# Patient Record
Sex: Male | Born: 1945 | Hispanic: No | Marital: Married | Smoking: Former smoker
Health system: Southern US, Community
[De-identification: ages and names within clinical notes are randomized; demographics above are authoritative.]

## PROBLEM LIST (undated history)

## (undated) DIAGNOSIS — I2510 Atherosclerotic heart disease of native coronary artery without angina pectoris: Secondary | ICD-10-CM

## (undated) DIAGNOSIS — E119 Type 2 diabetes mellitus without complications: Secondary | ICD-10-CM

## (undated) DIAGNOSIS — N200 Calculus of kidney: Secondary | ICD-10-CM

## (undated) DIAGNOSIS — I10 Essential (primary) hypertension: Secondary | ICD-10-CM

## (undated) DIAGNOSIS — G709 Myoneural disorder, unspecified: Secondary | ICD-10-CM

## (undated) DIAGNOSIS — M1990 Unspecified osteoarthritis, unspecified site: Secondary | ICD-10-CM

## (undated) DIAGNOSIS — Z87442 Personal history of urinary calculi: Secondary | ICD-10-CM

## (undated) DIAGNOSIS — K219 Gastro-esophageal reflux disease without esophagitis: Secondary | ICD-10-CM

## (undated) DIAGNOSIS — Z8719 Personal history of other diseases of the digestive system: Secondary | ICD-10-CM

## (undated) HISTORY — PX: OTHER SURGICAL HISTORY: SHX169

## (undated) HISTORY — PX: CORONARY ANGIOPLASTY: SHX604

## (undated) HISTORY — PX: TONSILLECTOMY: SUR1361

## (undated) HISTORY — PX: MASTOIDECTOMY: SUR855

---

## 2015-02-23 ENCOUNTER — Other Ambulatory Visit: Payer: Self-pay | Admitting: Neurosurgery

## 2015-02-26 NOTE — H&P (Signed)
Patient ID:   [id number] Patient: Marcus Carlson  Date of Birth: 12-04-1945 Visit Type: Office Visit   Date: 02/22/2015 11:30 AM Provider: Danae OrleansJoseph D. Venetia MaxonStern MD   This 69 year old male presents for back pain and numbness.  History of Present Illness: 1.  back pain  2.  numbness  Marcus Carlson, 69 year old retired male, visits on urgent referral from Dr. Antony Maduraaly in [place] for left foot drop.  Patient states he has been working with Dr. Antony Maduraaly trying to avoid surgery for left leg pain, weakness, and numbness.  He recalls driving to [place] October 1 in finding his left foot numb and weak upon exiting his car.  Currently, he is unable to dorsiflex with the left great toe.  Cervical ESI 3 many years ago Lumbar ESI 1 decreased pain but did not relieve numbness or weakness  History: NIDDM Surgical history: Mastoid cyst resection 1991, bicep repair 1997, tonsillectomy in childhood, 2006 cardiac stent  Patient is followed by Dr. Hyacinth MeekerMiller, cardiologist in [place].  He states his last stress test was last year and was found to be normal.  MRI and x-ray on [facility]  Lumbar imaging demonstrates severe spinal stenosis at the L4 L5 level.  There is minimal spondylolisthesis at this level.  The patient has developed a left foot drop over the last month.        PAST MEDICAL/SURGICAL HISTORY   (Detailed)  Disease/disorder Onset Date Management Date Comments  Anxiety      Diabetes mellitus      High cholesterol      Hypertension         PAST MEDICAL HISTORY, SURGICAL HISTORY, FAMILY HISTORY, SOCIAL HISTORY AND REVIEW OF SYSTEMS I have reviewed the patient's past medical, surgical, family and social history as well as the comprehensive review of systems as included on the [facility] history form dated 02/22/2015, which I have signed.  Family History  (Detailed)   SOCIAL HISTORY  (Detailed) Tobacco use reviewed. Preferred language is Unknown.   Smoking status:  Former smoker.  SMOKING STATUS Use Status Type Smoking Status Usage Per Day Years Used Total Pack Years  yes  Former smoker       HOME ENVIRONMENT/SAFETY The patient has not fallen in the last year.        MEDICATIONS(added, continued or stopped this visit): Started Medication Directions Instruction Stopped   aspirin 81 mg tablet,delayed release take 1 tablet by oral route  every day     Cymbalta take 1 capsule by oral route  every day       ALLERGIES: Ingredient Reaction Medication Name Comment  IOVERSOL Nausea/Vomiting     Reviewed, updated.   REVIEW OF SYSTEMS System Neg/Pos Details  Constitutional Negative Chills, fatigue, fever, malaise, night sweats, weight gain and weight loss.  ENMT Negative Ear drainage, hearing loss, nasal drainage, otalgia, sinus pressure and sore throat.  Eyes Negative Eye discharge, eye pain and vision changes.  Respiratory Negative Chronic cough, cough, dyspnea, known TB exposure and wheezing.  Cardio Negative Chest pain, claudication, edema and irregular heartbeat/palpitations.  GI Negative Abdominal pain, blood in stool, change in stool pattern, constipation, decreased appetite, diarrhea, heartburn, nausea and vomiting.  GU Negative Dribbling, dysuria, erectile dysfunction, hematuria, polyuria, slow stream, urinary frequency, urinary incontinence and urinary retention.  Endocrine Negative Cold intolerance, heat intolerance, polydipsia and polyphagia.  Neuro Positive Gait disturbance.  Psych Negative Anxiety, depression and insomnia.  Integumentary Negative Brittle hair, brittle nails, change in shape/size of  mole(s), hair loss, hirsutism, hives, pruritus, rash and skin lesion.  MS Positive Muscle weakness.  Hema/Lymph Negative Easy bleeding, easy bruising and lymphadenopathy.  Allergic/Immuno Negative Contact allergy, environmental allergies, food allergies and seasonal allergies.  Reproductive Negative Penile discharge and sexual  dysfunction.     Vitals Date Temp F BP Pulse Ht In Wt Lb BMI BSA Pain Score  02/22/2015  145/85 98 75 228 28.5  2/10     PHYSICAL EXAM General Level of Distress: no acute distress Overall Appearance: normal  Head and Face  Right Left  Fundoscopic Exam:  normal normal    Cardiovascular Cardiac: regular rate and rhythm without murmur  Right Left  Carotid Pulses: normal normal  Respiratory Lungs: clear to auscultation  Neurological Orientation: normal Recent and Remote Memory: normal Attention Span and Concentration:   normal Language: normal Fund of Knowledge: normal  Right Left Sensation: normal normal Upper Extremity Coordination: normal normal  Lower Extremity Coordination: normal normal  Musculoskeletal Gait and Station: normal  Right Left Upper Extremity Muscle Strength: normal normal Lower Extremity Muscle Strength: normal normal Upper Extremity Muscle Tone:  normal normal Lower Extremity Muscle Tone: normal normal  Motor Strength Upper and lower extremity motor strength was tested in the clinically pertinent muscles. Any abnormal findings will be noted below.   Right Left Tib Anterior:  4-/5 EHL:  3/5   Deep Tendon Reflexes  Right Left Biceps: normal normal Triceps: normal normal Brachiloradialis: normal normal Patellar: normal normal Achilles: normal normal  Sensory Sensation was tested at L1 to S1. Any abnormal findings will be noted below.  Right Left L5:  decreased   Cranial Nerves II. Optic Nerve/Visual Fields: normal III. Oculomotor: normal IV. Trochlear: normal V. Trigeminal: normal VI. Abducens: normal VII. Facial: normal VIII. Acoustic/Vestibular: normal IX. Glossopharyngeal: normal X. Vagus: normal XI. Spinal Accessory: normal XII. Hypoglossal: normal  Motor and other Tests Lhermittes: negative Rhomberg: negative Pronator  drift: absent     Right Left Hoffman's: normal normal Clonus: normal normal Babinski: normal normal SLR: negative positive at 40 degrees Patrick's Pearlean Brownie): negative negative Toe Walk: normal normal Toe Lift: normal normal Heel Walk: normal normal SI Joint: nontender nontender   Additional Findings:  The patient has 4 minus out of 5 left hip abductor weakness.    IMPRESSION The patient has a subacute left foot drop and severe spinal stenosis at the L4 L5 level.  I have recommended expedited surgery for this condition.  The patient wanted to hold off until after Thanksgiving as he has already made plans to visit his family in [place].  We will therefore proceed with decompression and fusion at the L4 L5 level on 03/10/15.  Completed Orders (this encounter) Order Details Reason Side Interpretation Result Initial Treatment Date Region  Lumbar Spine- AP/Lat/Obls/Spot/Flex/Ex      02/22/2015 All Levels to All Levels   Assessment/Plan # Detail Type Description   1. Assessment Idiopathic scoliosis of lumbar region (M41.26).       2. Assessment Lumbar radiculopathy (M54.16).       3. Assessment Spondylolisthesis, lumbar region (M43.16).       4. Assessment Lumbar stenosis (M48.06).         Pain Assessment/Treatment Pain Scale: 2/10. Method: Numeric Pain Intensity Scale. Location: back. Onset: 01/07/2015. Duration: varies. Quality: discomforting. Pain Assessment/Treatment follow-up plan of care: Patient is taking medications as prescribed..  Fall Risk Plan The patient has not fallen in the last year.  The patient was fitted fora brace today.  We  are arranging for him to have cardiology clearance with Dr. Hyacinth Meeker in [place] prior to surgery.  The risks and benefits were discussed in detail with the patient and he wishes to proceed.  We went over models and his imaging and he has a good understanding of the pathophysiology of his condition and the planned surgical  intervention.  Orders: Diagnostic Procedures: Assessment Procedure  M43.16 L4-L5 MAS PLIF  M43.16 Lumbar Spine- AP/Lat  M43.16 Return to Clinic  M54.16 Lumbar Spine- AP/Lat/Obls/Spot/Flex/Ex             Provider:  Danae Orleans. Venetia Maxon MD  02/25/2015 03:43 PM Dictation edited by: Danae Orleans. Venetia Maxon    CC Providers: Collene Leyden [address] [place], [place] [address]-              Electronically signed by Danae Orleans Venetia Maxon MD on 02/25/2015 03:43 PM

## 2015-03-07 NOTE — Progress Notes (Addendum)
Anesthesia Chart Review: Patient is a 69 year old male scheduled for L4-5 MAS PLIF on 03/10/15 by Dr. Venetia MaxonStern. PAT is scheduled for 03/09/15.  Dr. Fredrich BirksStern's office faxed over cardiology records with clearance note from Dr. Daryel NovemberGary Miller with [facility] [place]. According to these records, history includes CAD, DM2, dyslipidemia, GERD, nicotine excess (resolved in 2006). PCP is Dr. Heron NayVasireddy.   07/13/14 EKG (Dr. Hyacinth MeekerMiller): NSR.  04/11/14 Nuclear stress test (Dr. Hyacinth MeekerMiller): Conclusion:  1. Summed rest score = 4. Summed stress score o= 6. 2. Overall good LV contraction. EF 62%. 3. No significant reversible ischemic defect. 4. Low risk scan.  Med list is not yet recorded. As of 07/2014, he was on duloxetine, Nexium, amlodipine, lisinopril/HCTZ, glimepiride, clopidogrel, atorvastatin, Celebrex, metformin, ASA 81 mg, Coreg. Dr. Hyacinth MeekerMiller has given permission to hold Plavix for five days prior to surgery, but wanted patient to continue ASA. Marland Kitchen.   He will get labs at his PAT visit. (Update12/1/16 2:44PM: Today's CBC, BMET, T&S noted. A1C is in process. He reported fasting CBGs run 104-150.)  History will be completed/updated at his PAT visit. If any additional records needed then they will be requested at that time.  Velna Ochsllison Racine Erby, PA-C [facility] Short [facility]/Anesthesiology Phone [phone] 03/07/2015 5:50 PM

## 2015-03-09 ENCOUNTER — Encounter (HOSPITAL_COMMUNITY): Payer: Self-pay

## 2015-03-09 ENCOUNTER — Encounter (HOSPITAL_COMMUNITY)
Admission: RE | Admit: 2015-03-09 | Discharge: 2015-03-09 | Disposition: A | Discharge status: Home / Self Care | Payer: Medicare Other | Source: Ambulatory Visit | Attending: Neurosurgery | Admitting: Neurosurgery

## 2015-03-09 HISTORY — DX: Myoneural disorder, unspecified: G70.9

## 2015-03-09 HISTORY — DX: Gastro-esophageal reflux disease without esophagitis: K21.9

## 2015-03-09 HISTORY — DX: Essential (primary) hypertension: I10

## 2015-03-09 HISTORY — DX: Personal history of other diseases of the digestive system: Z87.19

## 2015-03-09 HISTORY — DX: Atherosclerotic heart disease of native coronary artery without angina pectoris: I25.10

## 2015-03-09 HISTORY — DX: Type 2 diabetes mellitus without complications: E11.9

## 2015-03-09 HISTORY — DX: Calculus of kidney: N20.0

## 2015-03-09 HISTORY — DX: Unspecified osteoarthritis, unspecified site: M19.90

## 2015-03-09 LAB — BASIC METABOLIC PANEL
ANION GAP: 8 (ref 5–15)
BUN: 9 mg/dL (ref 6–20)
CALCIUM: 9 mg/dL (ref 8.9–10.3)
CO2: 27 mmol/L (ref 22–32)
Chloride: 105 mmol/L (ref 101–111)
Creatinine, Ser: 0.91 mg/dL (ref 0.61–1.24)
GFR calc Af Amer: >60 mL/min (ref >60)
GLUCOSE: 148 mg/dL — AB (ref 65–99)
Potassium: 4 mmol/L (ref 3.5–5.1)
Sodium: 140 mmol/L (ref 135–145)

## 2015-03-09 LAB — TYPE AND SCREEN
ABO/RH(D): A POS
ANTIBODY SCREEN: NEGATIVE

## 2015-03-09 LAB — CBC
HCT: 42.8 % (ref 39.0–52.0)
Hemoglobin: 13.8 g/dL (ref 13.0–17.0)
MCH: 29.8 pg (ref 26.0–34.0)
MCHC: 32.2 g/dL (ref 30.0–36.0)
MCV: 92.4 fL (ref 78.0–100.0)
Platelets: 324 10*3/uL (ref 150–400)
RBC: 4.63 MIL/uL (ref 4.22–5.81)
RDW: 12.9 % (ref 11.5–15.5)
WBC: 5.6 10*3/uL (ref 4.0–10.5)

## 2015-03-09 LAB — SURGICAL PCR SCREEN
MRSA, PCR: NEGATIVE
STAPHYLOCOCCUS AUREUS: NEGATIVE

## 2015-03-09 LAB — GLUCOSE, CAPILLARY: Glucose-Capillary: 164 mg/dL — ABNORMAL HIGH (ref 65–99)

## 2015-03-09 LAB — ABO/RH: ABO/RH(D): A POS

## 2015-03-09 NOTE — Pre-Procedure Instructions (Signed)
Marcus [facility]son  03/09/2015      [facility] [address] - [place], [place] - [id number] [place] [address] Phone: [phone] Fax: [phone]  [facility] [place], [place] - [address] [place] [id number] Phone: [phone] Fax: [phone]    Your procedure is scheduled on Dec. 2   [facility] Admitting at 1100 A.M.  Call this number if you have problems the morning of surgery:  [phone]   Remember:  Do not eat food or drink liquids after midnight.  Take these medicines the morning of surgery with A SIP OF WATER amlodipine (norvasc), Take aspirin as directed by your Dr., Carvedilol (Coreg), Duloxetine (cymbalta), fluticasone (Flonase), ranitidine (zantac)  Stop taking aleve, BC's, goody's, Herbal medications, Fish Oil, Plavix (as directed by your Dr) How to Manage Your Diabetes Before Surgery   Why is it important to control my blood sugar before and after surgery?   Improving blood sugar levels before and after surgery helps healing and can limit problems.  A way of improving blood sugar control is eating a healthy diet by:  - Eating less sugar and carbohydrates  - Increasing activity/exercise  - Talk with your doctor about reaching your blood sugar goals  High blood sugars (greater than 180 mg/dL) can raise your risk of infections and slow down your recovery so you will need to focus on controlling your diabetes during the weeks before surgery.  Make sure that the doctor who takes care of your diabetes knows about your planned surgery including the date and location.  How do I manage my blood sugars before surgery?   Check your blood sugar at least 4 times a day, 2 days before surgery to make sure that they are not too high or low.   Check your blood sugar the morning of your surgery when you wake up and every 2               hours until you get to the  [facility] unit.  If your blood sugar is less than 70 mg/dL, you will need to treat for low blood sugar by:  Treat a low blood sugar (less than 70 mg/dL) with 1/2 cup of clear juice (cranberry or apple), 4 glucose tablets, OR glucose gel.  Recheck blood sugar in 15 minutes after treatment (to make sure it is greater than 70 mg/dL).  If blood sugar is not greater than 70 mg/dL on re-check, call [phone] for further instructions.   Report your blood sugar to the [facility] nurse when you get to [facility].  References:  [facility], 2007 "How to Manage your Diabetes Before and After Surgery".  What do I do about my diabetes medications?   Do not take oral diabetes medicines (pills) the morning of surgery. Glimepiride (Amaryl) or metformin (Glucophage)   Do not take other diabetes injectables the day of surgery including Byetta, Victoza, Bydureon, and Trulicity.    If your CBG is greater than 220 mg/dL, you may take 1/2 of your sliding scale (correction) dose of insulin.   Do not wear jewelry, make-up or nail polish.  Do not wear lotions, powders, or perfumes.  You may wear deodorant.  Do not shave 48 hours prior to surgery.  Men may shave face and neck.  Do not bring valuables to the [facility].  [facility] is not responsible for any belongings  or valuables.  Contacts, dentures or bridgework may not be worn into surgery.  Leave your suitcase in the car.  After surgery it may be brought to your room.  For patients admitted to the [facility], discharge time will be determined by your treatment team.  Patients discharged the day of surgery will not be allowed to drive home.   Special instructions:  [facility] - Preparing for Surgery  Before surgery, you can play an important role.  Because skin is not sterile, your skin needs to be as free of germs as possible.  You can reduce the number of germs on you skin by washing with CHG (chlorahexidine gluconate)  soap before surgery.  CHG is an antiseptic cleaner which kills germs and bonds with the skin to continue killing germs even after washing.  Please DO NOT use if you have an allergy to CHG or antibacterial soaps.  If your skin becomes reddened/irritated stop using the CHG and inform your nurse when you arrive at Short Stay.  Do not shave (including legs and underarms) for at least 48 hours prior to the first CHG shower.  You may shave your face.  Please follow these instructions carefully:   1.  Shower with CHG Soap the night before surgery and the     morning of Surgery.  2.  If you choose to wash your hair, wash your hair first as usual with your  normal shampoo.  3.  After you shampoo, rinse your hair and body thoroughly to remove the  Shampoo.  4.  Use CHG as you would any other liquid soap.  You can apply chg directly  to the skin and wash gently with scrungie or a clean washcloth.  5.  Apply the CHG Soap to your body ONLY FROM THE NECK DOWN.    Do not use on open wounds or open sores.  Avoid contact with your eyes, ears, mouth and genitals (private parts).  Wash genitals (private parts) with your normal soap.  6.  Wash thoroughly, paying special attention to the area where your surgery  will be performed.  7.  Thoroughly rinse your body with warm water from the neck down.  8.  DO NOT shower/wash with your normal soap after using and rinsing off    the CHG Soap.  9.  Pat yourself dry with a clean towel.            10.  Wear clean pajamas.            11.  Place clean sheets on your bed the night of your first shower and do not  sleep with pets.  Day of Surgery  Do not apply any lotions/deoderants the morning of surgery.  Please wear clean clothes to the [facility]/surgery [facility].    Please read over the following fact sheets that you were given. Pain Booklet, Coughing and Deep Breathing, MRSA Information and Surgical Site Infection Prevention

## 2015-03-09 NOTE — Progress Notes (Addendum)
PCP is Dr Heron NayVasireddy Cardiologist is Dr Hyacinth MeekerMiller- clearance placed on chart Reports his fasting CBG's run 104-150's. Last dose of Plavix was 03-02-15, and has continued to take his ASA as directed.

## 2015-03-10 ENCOUNTER — Inpatient Hospital Stay (HOSPITAL_COMMUNITY): Payer: Medicare Other | Admitting: Vascular Surgery

## 2015-03-10 ENCOUNTER — Encounter (HOSPITAL_COMMUNITY): Payer: Self-pay | Admitting: *Deleted

## 2015-03-10 ENCOUNTER — Inpatient Hospital Stay (HOSPITAL_COMMUNITY): Payer: Medicare Other

## 2015-03-10 ENCOUNTER — Encounter (HOSPITAL_COMMUNITY)
Admission: RE | Disposition: A | Discharge status: Home / Self Care | Payer: Self-pay | Source: Ambulatory Visit | Attending: Neurosurgery

## 2015-03-10 ENCOUNTER — Inpatient Hospital Stay (HOSPITAL_COMMUNITY): Payer: Medicare Other | Admitting: Anesthesiology

## 2015-03-10 ENCOUNTER — Inpatient Hospital Stay (HOSPITAL_COMMUNITY)
Admission: RE | Admit: 2015-03-10 | Discharge: 2015-03-11 | DRG: 460 | Disposition: A | Discharge status: Home / Self Care | Payer: Medicare Other | Source: Ambulatory Visit | Attending: Neurosurgery | Admitting: Neurosurgery

## 2015-03-10 DIAGNOSIS — Z419 Encounter for procedure for purposes other than remedying health state, unspecified: Secondary | ICD-10-CM

## 2015-03-10 DIAGNOSIS — M5416 Radiculopathy, lumbar region: Principal | ICD-10-CM | POA: Diagnosis present

## 2015-03-10 DIAGNOSIS — F419 Anxiety disorder, unspecified: Secondary | ICD-10-CM | POA: Diagnosis present

## 2015-03-10 DIAGNOSIS — Z955 Presence of coronary angioplasty implant and graft: Secondary | ICD-10-CM

## 2015-03-10 DIAGNOSIS — I10 Essential (primary) hypertension: Secondary | ICD-10-CM | POA: Diagnosis present

## 2015-03-10 DIAGNOSIS — I2510 Atherosclerotic heart disease of native coronary artery without angina pectoris: Secondary | ICD-10-CM | POA: Diagnosis present

## 2015-03-10 DIAGNOSIS — Z87891 Personal history of nicotine dependence: Secondary | ICD-10-CM

## 2015-03-10 DIAGNOSIS — E7800 Pure hypercholesterolemia, unspecified: Secondary | ICD-10-CM | POA: Diagnosis present

## 2015-03-10 DIAGNOSIS — K219 Gastro-esophageal reflux disease without esophagitis: Secondary | ICD-10-CM | POA: Diagnosis present

## 2015-03-10 DIAGNOSIS — M4120 Other idiopathic scoliosis, site unspecified: Secondary | ICD-10-CM | POA: Diagnosis present

## 2015-03-10 DIAGNOSIS — M4316 Spondylolisthesis, lumbar region: Secondary | ICD-10-CM | POA: Diagnosis present

## 2015-03-10 DIAGNOSIS — M21372 Foot drop, left foot: Secondary | ICD-10-CM | POA: Diagnosis present

## 2015-03-10 DIAGNOSIS — M549 Dorsalgia, unspecified: Secondary | ICD-10-CM | POA: Diagnosis present

## 2015-03-10 DIAGNOSIS — M4806 Spinal stenosis, lumbar region: Secondary | ICD-10-CM | POA: Diagnosis present

## 2015-03-10 DIAGNOSIS — E119 Type 2 diabetes mellitus without complications: Secondary | ICD-10-CM | POA: Diagnosis present

## 2015-03-10 HISTORY — PX: MAXIMUM ACCESS (MAS)POSTERIOR LUMBAR INTERBODY FUSION (PLIF) 1 LEVEL: SHX6368

## 2015-03-10 HISTORY — DX: Type 2 diabetes mellitus without complications: E11.9

## 2015-03-10 LAB — GLUCOSE, CAPILLARY
GLUCOSE-CAPILLARY: 146 mg/dL — AB (ref 65–99)
GLUCOSE-CAPILLARY: 226 mg/dL — AB (ref 65–99)
Glucose-Capillary: 169 mg/dL — ABNORMAL HIGH (ref 65–99)
Glucose-Capillary: 162 mg/dL — ABNORMAL HIGH (ref 65–99)

## 2015-03-10 LAB — HEMOGLOBIN A1C
HEMOGLOBIN A1C: 8.5 % — AB (ref 4.8–5.6)
Mean Plasma Glucose: 197 mg/dL

## 2015-03-10 SURGERY — FOR MAXIMUM ACCESS (MAS) POSTERIOR LUMBAR INTERBODY FUSION (PLIF) 1 LEVEL
Anesthesia: General | Site: Back

## 2015-03-10 MED ORDER — BUPIVACAINE LIPOSOME 1.3 % IJ SUSP
INTRAMUSCULAR | Status: DC | PRN
  Administered 2015-03-10: 20 mL

## 2015-03-10 MED ORDER — SUCCINYLCHOLINE CHLORIDE 20 MG/ML IJ SOLN
INTRAMUSCULAR | Status: DC | PRN
  Administered 2015-03-10: 140 mg via INTRAVENOUS

## 2015-03-10 MED ORDER — PHENYLEPHRINE 40 MCG/ML (10ML) SYRINGE FOR IV PUSH (FOR BLOOD PRESSURE SUPPORT)
PREFILLED_SYRINGE | INTRAVENOUS | Status: AC
  Filled 2015-03-10: qty 10

## 2015-03-10 MED ORDER — 0.9 % SODIUM CHLORIDE (POUR BTL) OPTIME
TOPICAL | Status: DC | PRN
  Administered 2015-03-10: 1000 mL

## 2015-03-10 MED ORDER — FENTANYL CITRATE (PF) 100 MCG/2ML IJ SOLN
INTRAMUSCULAR | Status: AC
Start: 2015-03-10 — End: 2015-03-11
  Filled 2015-03-10: qty 2

## 2015-03-10 MED ORDER — HYDROMORPHONE HCL 1 MG/ML IJ SOLN: 0.5000 mg | INTRAMUSCULAR | Status: DC | PRN

## 2015-03-10 MED ORDER — PROPOFOL 500 MG/50ML IV EMUL
INTRAVENOUS | Status: DC | PRN
  Administered 2015-03-10: 50 ug/kg/min via INTRAVENOUS

## 2015-03-10 MED ORDER — FENTANYL CITRATE (PF) 250 MCG/5ML IJ SOLN
INTRAMUSCULAR | Status: AC
  Filled 2015-03-10: qty 5

## 2015-03-10 MED ORDER — LISINOPRIL 20 MG PO TABS
20.0000 mg | ORAL_TABLET | Freq: Every day | ORAL | Status: DC
  Administered 2015-03-11: 20 mg via ORAL
  Filled 2015-03-10: qty 1

## 2015-03-10 MED ORDER — OXYCODONE-ACETAMINOPHEN 5-325 MG PO TABS
ORAL_TABLET | ORAL | Status: AC
  Administered 2015-03-10: 1 via ORAL
  Filled 2015-03-10: qty 1

## 2015-03-10 MED ORDER — BISACODYL 10 MG RE SUPP: 10.0000 mg | Freq: Every day | RECTAL | Status: DC | PRN

## 2015-03-10 MED ORDER — ONDANSETRON HCL 4 MG/2ML IJ SOLN: 4.0000 mg | Freq: Once | INTRAMUSCULAR | Status: DC | PRN

## 2015-03-10 MED ORDER — ATORVASTATIN CALCIUM 20 MG PO TABS
20.0000 mg | ORAL_TABLET | Freq: Every day | ORAL | Status: DC
  Administered 2015-03-10: 20 mg via ORAL
  Filled 2015-03-10: qty 1

## 2015-03-10 MED ORDER — PROPOFOL 10 MG/ML IV BOLUS
INTRAVENOUS | Status: AC
  Filled 2015-03-10: qty 20

## 2015-03-10 MED ORDER — CEFAZOLIN SODIUM 1-5 GM-% IV SOLN
1.0000 g | Freq: Three times a day (TID) | INTRAVENOUS | Status: AC
  Administered 2015-03-10 – 2015-03-11 (×2): 1 g via INTRAVENOUS
  Filled 2015-03-10 (×2): qty 50

## 2015-03-10 MED ORDER — OXYCODONE-ACETAMINOPHEN 5-325 MG PO TABS
1.0000 | ORAL_TABLET | ORAL | Status: DC | PRN
  Administered 2015-03-10: 1 via ORAL
  Administered 2015-03-10: 2 via ORAL
  Administered 2015-03-10: 1 via ORAL
  Administered 2015-03-11 (×2): 2 via ORAL
  Filled 2015-03-10 (×3): qty 2

## 2015-03-10 MED ORDER — PROPOFOL 10 MG/ML IV BOLUS
INTRAVENOUS | Status: DC | PRN
  Administered 2015-03-10: 150 mg via INTRAVENOUS

## 2015-03-10 MED ORDER — FLEET ENEMA 7-19 GM/118ML RE ENEM
1.0000 | ENEMA | Freq: Once | RECTAL | Status: DC | PRN
Start: 2015-03-10 — End: 2015-03-11

## 2015-03-10 MED ORDER — LIDOCAINE HCL (CARDIAC) 20 MG/ML IV SOLN
INTRAVENOUS | Status: DC | PRN
  Administered 2015-03-10: 100 mg via INTRAVENOUS

## 2015-03-10 MED ORDER — GLIMEPIRIDE 1 MG PO TABS
1.0000 mg | ORAL_TABLET | Freq: Every day | ORAL | Status: DC
  Administered 2015-03-10: 1 mg via ORAL
  Filled 2015-03-10 (×2): qty 1

## 2015-03-10 MED ORDER — LIDOCAINE-EPINEPHRINE 1 %-1:100000 IJ SOLN
INTRAMUSCULAR | Status: DC | PRN
  Administered 2015-03-10: 5 mL

## 2015-03-10 MED ORDER — LACTATED RINGERS IV SOLN
INTRAVENOUS | Status: DC
  Administered 2015-03-10 (×4): via INTRAVENOUS

## 2015-03-10 MED ORDER — DEXTROSE 5 % IV SOLN
10.0000 mg | INTRAVENOUS | Status: DC | PRN
  Administered 2015-03-10: 20 ug/min via INTRAVENOUS

## 2015-03-10 MED ORDER — AMLODIPINE BESYLATE 10 MG PO TABS
10.0000 mg | ORAL_TABLET | Freq: Every day | ORAL | Status: DC
  Administered 2015-03-11: 10 mg via ORAL
  Filled 2015-03-10: qty 1

## 2015-03-10 MED ORDER — BUPIVACAINE HCL (PF) 0.5 % IJ SOLN
INTRAMUSCULAR | Status: DC | PRN
  Administered 2015-03-10: 5 mL

## 2015-03-10 MED ORDER — PANTOPRAZOLE SODIUM 40 MG IV SOLR
40.0000 mg | Freq: Every day | INTRAVENOUS | Status: DC
  Administered 2015-03-10: 40 mg via INTRAVENOUS
  Filled 2015-03-10: qty 40

## 2015-03-10 MED ORDER — METHOCARBAMOL 500 MG PO TABS
ORAL_TABLET | ORAL | Status: AC
  Administered 2015-03-10: 500 mg via ORAL
  Filled 2015-03-10: qty 1

## 2015-03-10 MED ORDER — ASPIRIN EC 81 MG PO TBEC
81.0000 mg | DELAYED_RELEASE_TABLET | Freq: Every day | ORAL | Status: DC
  Administered 2015-03-11: 81 mg via ORAL
  Filled 2015-03-10 (×2): qty 1

## 2015-03-10 MED ORDER — KCL IN DEXTROSE-NACL 20-5-0.45 MEQ/L-%-% IV SOLN
INTRAVENOUS | Status: DC
  Filled 2015-03-10 (×3): qty 1000

## 2015-03-10 MED ORDER — INSULIN ASPART 100 UNIT/ML ~~LOC~~ SOLN: 0.0000 [IU] | Freq: Three times a day (TID) | SUBCUTANEOUS | Status: DC

## 2015-03-10 MED ORDER — OXYCODONE-ACETAMINOPHEN 5-325 MG PO TABS
ORAL_TABLET | ORAL | Status: AC
  Filled 2015-03-10: qty 1

## 2015-03-10 MED ORDER — CARVEDILOL 6.25 MG PO TABS
6.2500 mg | ORAL_TABLET | Freq: Two times a day (BID) | ORAL | Status: DC
  Administered 2015-03-10 – 2015-03-11 (×2): 6.25 mg via ORAL
  Filled 2015-03-10 (×2): qty 1

## 2015-03-10 MED ORDER — METHOCARBAMOL 1000 MG/10ML IJ SOLN
500.0000 mg | Freq: Four times a day (QID) | INTRAVENOUS | Status: DC | PRN
  Filled 2015-03-10: qty 5

## 2015-03-10 MED ORDER — METHOCARBAMOL 500 MG PO TABS
500.0000 mg | ORAL_TABLET | Freq: Four times a day (QID) | ORAL | Status: DC | PRN
  Administered 2015-03-10 – 2015-03-11 (×3): 500 mg via ORAL
  Filled 2015-03-10 (×2): qty 1

## 2015-03-10 MED ORDER — ROCURONIUM BROMIDE 50 MG/5ML IV SOLN
INTRAVENOUS | Status: AC
  Filled 2015-03-10: qty 1

## 2015-03-10 MED ORDER — ONDANSETRON HCL 4 MG/2ML IJ SOLN: 4.0000 mg | INTRAMUSCULAR | Status: DC | PRN

## 2015-03-10 MED ORDER — LIDOCAINE HCL (CARDIAC) 20 MG/ML IV SOLN
INTRAVENOUS | Status: AC
  Filled 2015-03-10: qty 5

## 2015-03-10 MED ORDER — MENTHOL 3 MG MT LOZG
1.0000 | LOZENGE | OROMUCOSAL | Status: DC | PRN
  Filled 2015-03-10: qty 9

## 2015-03-10 MED ORDER — FLUTICASONE PROPIONATE 50 MCG/ACT NA SUSP
1.0000 | Freq: Two times a day (BID) | NASAL | Status: DC
  Administered 2015-03-10 – 2015-03-11 (×2): 1 via NASAL
  Filled 2015-03-10: qty 16

## 2015-03-10 MED ORDER — FENTANYL CITRATE (PF) 100 MCG/2ML IJ SOLN
25.0000 ug | INTRAMUSCULAR | Status: DC | PRN
  Administered 2015-03-10 (×2): 25 ug via INTRAVENOUS
  Administered 2015-03-10 (×2): 50 ug via INTRAVENOUS

## 2015-03-10 MED ORDER — HYDROCHLOROTHIAZIDE 12.5 MG PO CAPS
12.5000 mg | ORAL_CAPSULE | Freq: Every day | ORAL | Status: DC
  Administered 2015-03-11: 12.5 mg via ORAL
  Filled 2015-03-10: qty 1

## 2015-03-10 MED ORDER — CEFAZOLIN SODIUM-DEXTROSE 2-3 GM-% IV SOLR
2.0000 g | INTRAVENOUS | Status: AC
  Administered 2015-03-10: 2 g via INTRAVENOUS

## 2015-03-10 MED ORDER — DOCUSATE SODIUM 100 MG PO CAPS
100.0000 mg | ORAL_CAPSULE | Freq: Two times a day (BID) | ORAL | Status: DC
  Administered 2015-03-10 – 2015-03-11 (×2): 100 mg via ORAL
  Filled 2015-03-10 (×2): qty 1

## 2015-03-10 MED ORDER — SENNOSIDES-DOCUSATE SODIUM 8.6-50 MG PO TABS: 1.0000 | ORAL_TABLET | Freq: Every evening | ORAL | Status: DC | PRN

## 2015-03-10 MED ORDER — SODIUM CHLORIDE 0.9 % IJ SOLN
3.0000 mL | Freq: Two times a day (BID) | INTRAMUSCULAR | Status: DC
  Administered 2015-03-10: 3 mL via INTRAVENOUS

## 2015-03-10 MED ORDER — PHENOL 1.4 % MT LIQD: 1.0000 | OROMUCOSAL | Status: DC | PRN

## 2015-03-10 MED ORDER — ALUM & MAG HYDROXIDE-SIMETH 200-200-20 MG/5ML PO SUSP
30.0000 mL | Freq: Four times a day (QID) | ORAL | Status: DC | PRN
Start: 2015-03-10 — End: 2015-03-11

## 2015-03-10 MED ORDER — FENTANYL CITRATE (PF) 100 MCG/2ML IJ SOLN
INTRAMUSCULAR | Status: DC | PRN
  Administered 2015-03-10 (×2): 50 ug via INTRAVENOUS
  Administered 2015-03-10: 100 ug via INTRAVENOUS
  Administered 2015-03-10: 50 ug via INTRAVENOUS
  Administered 2015-03-10: 150 ug via INTRAVENOUS

## 2015-03-10 MED ORDER — METFORMIN HCL 500 MG PO TABS
1000.0000 mg | ORAL_TABLET | Freq: Two times a day (BID) | ORAL | Status: DC
  Administered 2015-03-10 – 2015-03-11 (×2): 1000 mg via ORAL
  Filled 2015-03-10 (×2): qty 2

## 2015-03-10 MED ORDER — LISINOPRIL-HYDROCHLOROTHIAZIDE 20-12.5 MG PO TABS: 1.0000 | ORAL_TABLET | Freq: Every day | ORAL | Status: DC

## 2015-03-10 MED ORDER — PANTOPRAZOLE SODIUM 40 MG PO TBEC: 40.0000 mg | DELAYED_RELEASE_TABLET | Freq: Every day | ORAL | Status: DC

## 2015-03-10 MED ORDER — SUCCINYLCHOLINE CHLORIDE 20 MG/ML IJ SOLN
INTRAMUSCULAR | Status: AC
  Filled 2015-03-10: qty 1

## 2015-03-10 MED ORDER — THROMBIN 20000 UNITS EX SOLR
CUTANEOUS | Status: DC | PRN
  Administered 2015-03-10: 14:00:00 via TOPICAL

## 2015-03-10 MED ORDER — CEFAZOLIN SODIUM-DEXTROSE 2-3 GM-% IV SOLR
INTRAVENOUS | Status: AC
  Filled 2015-03-10: qty 50

## 2015-03-10 MED ORDER — FENTANYL CITRATE (PF) 100 MCG/2ML IJ SOLN
INTRAMUSCULAR | Status: AC
  Administered 2015-03-10: 25 ug via INTRAVENOUS
  Filled 2015-03-10: qty 2

## 2015-03-10 MED ORDER — SODIUM CHLORIDE 0.9 % IJ SOLN: 3.0000 mL | INTRAMUSCULAR | Status: DC | PRN

## 2015-03-10 MED ORDER — FAMOTIDINE 20 MG PO TABS
10.0000 mg | ORAL_TABLET | Freq: Every day | ORAL | Status: DC
  Administered 2015-03-10 – 2015-03-11 (×2): 10 mg via ORAL
  Filled 2015-03-10 (×2): qty 1

## 2015-03-10 MED ORDER — DOCUSATE SODIUM 100 MG PO CAPS: 100.0000 mg | ORAL_CAPSULE | Freq: Two times a day (BID) | ORAL | Status: DC

## 2015-03-10 MED ORDER — DULOXETINE HCL 30 MG PO CPEP
60.0000 mg | ORAL_CAPSULE | Freq: Every day | ORAL | Status: DC
  Administered 2015-03-11: 60 mg via ORAL
  Filled 2015-03-10: qty 2

## 2015-03-10 MED ORDER — ONDANSETRON HCL 4 MG/2ML IJ SOLN
INTRAMUSCULAR | Status: DC | PRN
Start: 2015-03-10 — End: 2015-03-10
  Administered 2015-03-10: 4 mg via INTRAVENOUS

## 2015-03-10 MED ORDER — ACETAMINOPHEN 650 MG RE SUPP: 650.0000 mg | RECTAL | Status: DC | PRN

## 2015-03-10 MED ORDER — ACETAMINOPHEN 325 MG PO TABS: 650.0000 mg | ORAL_TABLET | ORAL | Status: DC | PRN

## 2015-03-10 MED ORDER — HYDROCODONE-ACETAMINOPHEN 5-325 MG PO TABS
1.0000 | ORAL_TABLET | ORAL | Status: DC | PRN
Start: 2015-03-10 — End: 2015-03-11

## 2015-03-10 MED ORDER — ONDANSETRON HCL 4 MG/2ML IJ SOLN
INTRAMUSCULAR | Status: AC
  Filled 2015-03-10: qty 2

## 2015-03-10 MED ORDER — HYDROCORTISONE 1 % EX CREA
TOPICAL_CREAM | Freq: Two times a day (BID) | CUTANEOUS | Status: DC
  Administered 2015-03-10: 21:00:00 via TOPICAL
  Filled 2015-03-10: qty 28

## 2015-03-10 MED ORDER — BUPIVACAINE LIPOSOME 1.3 % IJ SUSP
20.0000 mL | Freq: Once | INTRAMUSCULAR | Status: DC
  Filled 2015-03-10: qty 20

## 2015-03-10 SURGICAL SUPPLY — 90 items
BENZOIN TINCTURE PRP APPL 2/3 (GAUZE/BANDAGES/DRESSINGS) IMPLANT
BLADE CLIPPER SURG (BLADE) ×3 IMPLANT
BUR MATCHSTICK NEURO 3.0 LAGG (BURR) ×3 IMPLANT
BUR PRECISION FLUTE 5.0 (BURR) ×6 IMPLANT
BUR ROUND FLUTED 5 RND (BURR) ×2 IMPLANT
BUR ROUND FLUTED 5MM RND (BURR) ×1
CAGE COROENT LG 12X9X23-12 (Cage) ×6 IMPLANT
CANISTER SUCT 3000ML PPV (MISCELLANEOUS) ×3 IMPLANT
CLIP NEUROVISION LG (CLIP) ×3 IMPLANT
CLOSURE WOUND 1/2 X4 (GAUZE/BANDAGES/DRESSINGS)
CONT SPEC 4OZ CLIKSEAL STRL BL (MISCELLANEOUS) ×3 IMPLANT
COVER BACK TABLE 24X17X13 BIG (DRAPES) IMPLANT
COVER BACK TABLE 60X90IN (DRAPES) ×3 IMPLANT
DECANTER SPIKE VIAL GLASS SM (MISCELLANEOUS) ×3 IMPLANT
DERMABOND ADVANCED (GAUZE/BANDAGES/DRESSINGS) ×2
DERMABOND ADVANCED .7 DNX12 (GAUZE/BANDAGES/DRESSINGS) ×1 IMPLANT
DRAPE C-ARM 42X72 X-RAY (DRAPES) ×3 IMPLANT
DRAPE C-ARMOR (DRAPES) ×3 IMPLANT
DRAPE LAPAROTOMY 100X72X124 (DRAPES) ×3 IMPLANT
DRAPE POUCH INSTRU U-SHP 10X18 (DRAPES) ×3 IMPLANT
DRAPE SURG 17X23 STRL (DRAPES) ×3 IMPLANT
DRSG OPSITE POSTOP 4X6 (GAUZE/BANDAGES/DRESSINGS) ×3 IMPLANT
DURAPREP 26ML APPLICATOR (WOUND CARE) ×3 IMPLANT
ELECT BLADE 4.0 EZ CLEAN MEGAD (MISCELLANEOUS) ×3
ELECT REM PT RETURN 9FT ADLT (ELECTROSURGICAL) ×3
ELECTRODE BLDE 4.0 EZ CLN MEGD (MISCELLANEOUS) ×1 IMPLANT
ELECTRODE REM PT RTRN 9FT ADLT (ELECTROSURGICAL) ×1 IMPLANT
EVACUATOR 1/8 PVC DRAIN (DRAIN) IMPLANT
GAUZE SPONGE 4X4 12PLY STRL (GAUZE/BANDAGES/DRESSINGS) IMPLANT
GAUZE SPONGE 4X4 16PLY XRAY LF (GAUZE/BANDAGES/DRESSINGS) IMPLANT
GLOVE BIO SURGEON STRL SZ8 (GLOVE) ×6 IMPLANT
GLOVE BIOGEL PI IND STRL 7.0 (GLOVE) ×2 IMPLANT
GLOVE BIOGEL PI IND STRL 7.5 (GLOVE) ×3 IMPLANT
GLOVE BIOGEL PI IND STRL 8 (GLOVE) ×2 IMPLANT
GLOVE BIOGEL PI IND STRL 8.5 (GLOVE) ×2 IMPLANT
GLOVE BIOGEL PI INDICATOR 7.0 (GLOVE) ×4
GLOVE BIOGEL PI INDICATOR 7.5 (GLOVE) ×6
GLOVE BIOGEL PI INDICATOR 8 (GLOVE) ×4
GLOVE BIOGEL PI INDICATOR 8.5 (GLOVE) ×4
GLOVE ECLIPSE 8.0 STRL XLNG CF (GLOVE) ×6 IMPLANT
GLOVE EXAM NITRILE LRG STRL (GLOVE) IMPLANT
GLOVE EXAM NITRILE MD LF STRL (GLOVE) IMPLANT
GLOVE EXAM NITRILE XL STR (GLOVE) IMPLANT
GLOVE EXAM NITRILE XS STR PU (GLOVE) IMPLANT
GLOVE SS BIOGEL STRL SZ 7 (GLOVE) ×1 IMPLANT
GLOVE SS N UNI LF 6.5 STRL (GLOVE) ×9 IMPLANT
GLOVE SS N UNI LF 7.0 STRL (GLOVE) ×9 IMPLANT
GLOVE SUPERSENSE BIOGEL SZ 7 (GLOVE) ×2
GOWN STRL REUS W/ TWL LRG LVL3 (GOWN DISPOSABLE) ×3 IMPLANT
GOWN STRL REUS W/ TWL XL LVL3 (GOWN DISPOSABLE) ×3 IMPLANT
GOWN STRL REUS W/TWL 2XL LVL3 (GOWN DISPOSABLE) ×6 IMPLANT
GOWN STRL REUS W/TWL LRG LVL3 (GOWN DISPOSABLE) ×6
GOWN STRL REUS W/TWL XL LVL3 (GOWN DISPOSABLE) ×6
KIT BASIN OR (CUSTOM PROCEDURE TRAY) ×3 IMPLANT
KIT POSITION SURG JACKSON T1 (MISCELLANEOUS) ×3 IMPLANT
KIT ROOM TURNOVER OR (KITS) ×3 IMPLANT
MILL MEDIUM DISP (BLADE) ×3 IMPLANT
MODULE NVM5 NEXT GEN EMG (NEEDLE) ×3 IMPLANT
NEEDLE HYPO 21X1.5 SAFETY (NEEDLE) ×3 IMPLANT
NEEDLE HYPO 25X1 1.5 SAFETY (NEEDLE) ×3 IMPLANT
NEEDLE SPNL 18GX3.5 QUINCKE PK (NEEDLE) ×3 IMPLANT
NS IRRIG 1000ML POUR BTL (IV SOLUTION) ×3 IMPLANT
PACK LAMINECTOMY NEURO (CUSTOM PROCEDURE TRAY) ×3 IMPLANT
PAD ARMBOARD 7.5X6 YLW CONV (MISCELLANEOUS) ×9 IMPLANT
PATTIES SURGICAL .5 X.5 (GAUZE/BANDAGES/DRESSINGS) IMPLANT
PATTIES SURGICAL .5 X1 (DISPOSABLE) IMPLANT
PATTIES SURGICAL 1X1 (DISPOSABLE) IMPLANT
PUTTY BONE ATTRAX 5CC STRIP (Putty) ×3 IMPLANT
ROD 5.5X40MM (Rod) ×6 IMPLANT
SCREW LOCK (Screw) ×8 IMPLANT
SCREW LOCK FXNS SPNE MAS PL (Screw) ×4 IMPLANT
SCREW SHANK 5.5X40MM (Screw) ×6 IMPLANT
SCREW SHANK 6.5X45 (Screw) ×6 IMPLANT
SCREW TULIP 5.5 (Screw) ×12 IMPLANT
SPONGE LAP 4X18 X RAY DECT (DISPOSABLE) IMPLANT
SPONGE SURGIFOAM ABS GEL 100 (HEMOSTASIS) ×3 IMPLANT
STAPLER SKIN PROX WIDE 3.9 (STAPLE) IMPLANT
STRIP CLOSURE SKIN 1/2X4 (GAUZE/BANDAGES/DRESSINGS) IMPLANT
SUT VIC AB 1 CT1 18XBRD ANBCTR (SUTURE) ×1 IMPLANT
SUT VIC AB 1 CT1 8-18 (SUTURE) ×2
SUT VIC AB 2-0 CT1 18 (SUTURE) ×3 IMPLANT
SUT VIC AB 3-0 SH 8-18 (SUTURE) ×6 IMPLANT
SYR 20CC LL (SYRINGE) ×3 IMPLANT
SYR 5ML LL (SYRINGE) IMPLANT
TOWEL OR 17X24 6PK STRL BLUE (TOWEL DISPOSABLE) ×3 IMPLANT
TOWEL OR 17X26 10 PK STRL BLUE (TOWEL DISPOSABLE) ×3 IMPLANT
TRAP SPECIMEN MUCOUS 40CC (MISCELLANEOUS) ×3 IMPLANT
TRAY FOLEY CATH 16FRSI W/METER (SET/KITS/TRAYS/PACK) ×3 IMPLANT
TRAY FOLEY W/METER SILVER 14FR (SET/KITS/TRAYS/PACK) IMPLANT
WATER STERILE IRR 1000ML POUR (IV SOLUTION) ×3 IMPLANT

## 2015-03-10 NOTE — Progress Notes (Signed)
Awake, alert, conversant.  MAEW with good strength.  Doing well. 

## 2015-03-10 NOTE — Anesthesia Preprocedure Evaluation (Addendum)
Anesthesia Evaluation  Patient identified by MRN, date of birth, ID band Patient awake    Reviewed: Allergy & Precautions, NPO status , Patient's Chart, lab work & pertinent test results, reviewed documented beta blocker date and time   History of Anesthesia Complications Negative for: history of anesthetic complications  Airway Mallampati: II  TM Distance: >3 FB Neck ROM: Full    Dental  (+) Dental Advisory Given, Lower Dentures, Partial Upper, Upper Dentures   Pulmonary former smoker,    Pulmonary exam normal breath sounds clear to auscultation       Cardiovascular Exercise Tolerance: Good hypertension, Pt. on medications and Pt. on home beta blockers (-) angina+ CAD and + Cardiac Stents (2006)  (-) Past MI and (-) CHF Normal cardiovascular exam Rhythm:Regular Rate:Normal  Cardiac clearance: 07/13/14 EKG (Dr. Hyacinth MeekerMiller): NSR.  04/11/14 Nuclear stress test (Dr. Hyacinth MeekerMiller): Conclusion:  1. Summed rest score = 4. Summed stress score o= 6. 2. Overall good LV contraction. EF 62%. 3. No significant reversible ischemic defect. 4. Low risk scan.   Neuro/Psych Left leg numbness, weakness negative psych ROS   GI/Hepatic Neg liver ROS, GERD  Medicated,  Endo/Other  diabetes, Type 2, Oral Hypoglycemic Agents  Renal/GU negative Renal ROS     Musculoskeletal  (+) Arthritis , Osteoarthritis,    Abdominal   Peds  Hematology negative hematology ROS (+)   Anesthesia Other Findings Day of surgery medications reviewed with the patient.  Reproductive/Obstetrics                           Anesthesia Physical Anesthesia Plan  ASA: III  Anesthesia Plan: General   Post-op Pain Management:    Induction: Intravenous  Airway Management Planned: Oral ETT  Additional Equipment:   Intra-op Plan:   Post-operative Plan: Extubation in OR  Informed Consent: I have reviewed the patients History and Physical,  chart, labs and discussed the procedure including the risks, benefits and alternatives for the proposed anesthesia with the patient or authorized representative who has indicated his/her understanding and acceptance.   Dental advisory given  Plan Discussed with: CRNA  Anesthesia Plan Comments: (Risks/benefits of general anesthesia discussed with patient including risk of damage to teeth, lips, gum, and tongue, nausea/vomiting, allergic reactions to medications, and the possibility of heart attack, stroke and death.  All patient questions answered.  Patient wishes to proceed.)        Anesthesia Quick Evaluation

## 2015-03-10 NOTE — Progress Notes (Signed)
Awake, alert, conversant.  MAEW with improved left DF strength.  Full right DF/EHL strength.  Minimal pain.  Doing well.

## 2015-03-10 NOTE — Anesthesia Procedure Notes (Signed)
Procedure Name: Intubation Date/Time: 03/10/2015 1:05 PM Performed by: Jefm MilesENNIE, Melis Trochez E Pre-anesthesia Checklist: Patient identified, Emergency Drugs available, Suction available, Patient being monitored and Timeout performed Patient Re-evaluated:Patient Re-evaluated prior to inductionOxygen Delivery Method: Circle system utilized Preoxygenation: Pre-oxygenation with 100% oxygen Intubation Type: IV induction Ventilation: Mask ventilation without difficulty and Oral airway inserted - appropriate to patient size Laryngoscope Size: Mac and 3 Grade View: Grade I Tube type: Oral Tube size: 7.5 mm Number of attempts: 1 Airway Equipment and Method: Stylet Placement Confirmation: ETT inserted through vocal cords under direct vision,  positive ETCO2 and breath sounds checked- equal and bilateral Secured at: 23 cm Tube secured with: Tape Dental Injury: Teeth and Oropharynx as per pre-operative assessment

## 2015-03-10 NOTE — Brief Op Note (Signed)
03/10/2015  4:35 PM  PATIENT:  [facility]  69 y.o. male  PRE-OPERATIVE DIAGNOSIS:  Spondylolisthesis, Lumbar region with foot drop, severe spinal stenosis, scoliosis L 45 level  POST-OPERATIVE DIAGNOSIS:  Spondylolisthesis, Lumbar region with foot drop, severe spinal stenosis, scoliosis L 45 level  PROCEDURE:  Procedure(s) with comments: Lumbar four-five Maximum access posterior lumbar interbody fusion (N/A) - Lumbar four-five Maximum access posterior lumbar interbody fusion with PEEK spacers, autograft, pedicle screw fixation, posterolateral arthrodesis  Decompression greater than for typical PLIF procedure  SURGEON:  Surgeon(s) and Role:    * Maeola HarmanJoseph Kendell Gammon, MD - Primary    * Loura HaltBenjamin Jared Ditty, MD - Assisting  PHYSICIAN ASSISTANT:   ASSISTANTS: Poteat, RN   ANESTHESIA:   general  EBL:  Total I/O In: 2000 [I.V.:2000] Out: 440 [Urine:240; Blood:200]  BLOOD ADMINISTERED:none  DRAINS: none   LOCAL MEDICATIONS USED:  MARCAINE    and LIDOCAINE   SPECIMEN:  No Specimen  DISPOSITION OF SPECIMEN:  N/A  COUNTS:  YES  TOURNIQUET:  * No tourniquets in log *  DICTATION: Patient is a 69 year old with scoliosis , severe stenosis, spondylolisthesis, disc herniation and severe back and left lower extremity pain and weakness with progressive foot drop  at L4/5 levels of the lumbar spine. It was elected to take him to surgery for MASPLIF L 45 level with posterolateral arthrodesis.  Procedure:   Following uncomplicated induction of GETA, and placement of electrodes for neural monitoring, patient was turned into a prone position on the [place] tableand using AP  fluoroscopy the area of planned incision was marked, prepped with betadine scrub and Duraprep, then draped. Exposure was performed of facet joint complex at L 45 level and the MAS retractor was placed.5.5 x 40 mm cortical Nuvasive screws were placed at L 4 bilaterally according to standard landmarks using neural monitoring.  A  total laminectomy of L 4 was then performed with disarticulation of facets.  Decompression was greater than for standard PLIF procedure with drilling of extensive bony overgrowth.  This bone was saved for grafting, combined with Atrax after being run through bone mill and was placed in bone packing device.  Thorough discectomy was performed bilaterally at L 45 and the endplates were prepared for grafting.  There was a disc herniation on the left with significant left L 5 nerve root compression.  The right side had bony overgrowth over the interspace and these osteophytes were removed with chisel.  Decompression was painstakingly performed with thorough decompression of the thecal sac and bilateral L 4 and L 5 nerve roots.  23 x 12 x 12 degree cages were placed in the interspace and positioning was confirmed with AP and lateral fluoroscopy.  20 cc of autograft/Atrax was packed in the interspace medial to the second cage.   Remaining screws were placed at L 5 (6.5 x 45) and 40 mm rods were placed.   And the screws were locked and torqued.Final Xrays showed well positioned implants and screw fixation. The posterolateral region was packed with remaining 30 cc of autograft on each side of midline. The wounds were irrigated and then closed with 1, 2-0 and 3-0 Vicryl stitches. Long-acting Marcaine was injected into the subcutaneous tissues. Sterile occlusive dressing was placed with Dermabond and occlusive dressing. The patient was then extubated in the operating room and taken to recovery in stable and satisfactory condition having tolerated his operation well. Counts were correct at the end of the case.  PLAN OF CARE: Admit to inpatient  PATIENT DISPOSITION:  PACU - hemodynamically stable.   Delay start of Pharmacological VTE agent (>24hrs) due to surgical blood loss or risk of bleeding: yes

## 2015-03-10 NOTE — Transfer of Care (Signed)
Immediate Anesthesia Transfer of Care Note  Patient: Marcus Carlson  Procedure(s) Performed: Procedure(s) with comments: Lumbar four-five Maximum access posterior lumbar interbody fusion (N/A) - Lumbar four-five Maximum access posterior lumbar interbody fusion  Patient Location: PACU  Anesthesia Type:General  Level of Consciousness: awake and oriented  Airway & Oxygen Therapy: Patient Spontanous Breathing and Patient connected to nasal cannula oxygen  Post-op Assessment: Report given to RN and Patient moving all extremities X 4  Post vital signs: Reviewed and stable  Last Vitals:  Filed Vitals:   03/10/15 1104 03/10/15 1637  BP: 118/88   Pulse: 85   Temp: 36.4 C 36.6 C    Complications: No apparent anesthesia complications

## 2015-03-10 NOTE — Anesthesia Postprocedure Evaluation (Signed)
Anesthesia Post Note  Patient: Carlye GrippeJames R Chamblee  Procedure(s) Performed: Procedure(s) (LRB): Lumbar four-five Maximum access posterior lumbar interbody fusion (N/A)  Patient location during evaluation: PACU Anesthesia Type: General Level of consciousness: sedated Pain management: pain level controlled Vital Signs Assessment: post-procedure vital signs reviewed and stable Respiratory status: spontaneous breathing and respiratory function stable Cardiovascular status: stable Anesthetic complications: no    Last Vitals:  Filed Vitals:   03/10/15 1730 03/10/15 1735  BP:    Pulse: 89 88  Temp:    Resp: 13 21    Last Pain:  Filed Vitals:   03/10/15 1736  PainSc: 4     LLE Motor Response: Purposeful movement;Responds to commands (03/10/15 1730) LLE Sensation: No numbness (03/10/15 1730) RLE Motor Response: Purposeful movement;Responds to commands (03/10/15 1730) RLE Sensation: No numbness (03/10/15 1730)      Aniruddh Ciavarella,Huston DANIEL

## 2015-03-10 NOTE — Interval H&P Note (Signed)
History and Physical Interval Note:  03/10/2015 7:26 AM  Marcus Carlson  has presented today for surgery, with the diagnosis of Spondylolisthesis, Lumbar region  The various methods of treatment have been discussed with the patient and family. After consideration of risks, benefits and other options for treatment, the patient has consented to  Procedure(s) with comments: L4-5 Maximum access posterior lumbar interbody fusion (N/A) - L4-5 Maximum access posterior lumbar interbody fusion as a surgical intervention .  The patient's history has been reviewed, patient examined, no change in status, stable for surgery.  I have reviewed the patient's chart and labs.  Questions were answered to the patient's satisfaction.     Glennette Galster D

## 2015-03-10 NOTE — Op Note (Signed)
03/10/2015  4:35 PM  PATIENT:  Marcus Carlson  69 y.o. male  PRE-OPERATIVE DIAGNOSIS:  Spondylolisthesis, Lumbar region with foot drop, severe spinal stenosis, scoliosis L 45 level  POST-OPERATIVE DIAGNOSIS:  Spondylolisthesis, Lumbar region with foot drop, severe spinal stenosis, scoliosis L 45 level  PROCEDURE:  Procedure(s) with comments: Lumbar four-five Maximum access posterior lumbar interbody fusion (N/A) - Lumbar four-five Maximum access posterior lumbar interbody fusion with PEEK spacers, autograft, pedicle screw fixation, posterolateral arthrodesis  Decompression greater than for typical PLIF procedure  SURGEON:  Surgeon(s) and Role:    * Lebert Lovern, MD - Primary    * Benjamin Jared Ditty, MD - Assisting  PHYSICIAN ASSISTANT:   ASSISTANTS: Poteat, RN   ANESTHESIA:   general  EBL:  Total I/O In: 2000 [I.V.:2000] Out: 440 [Urine:240; Blood:200]  BLOOD ADMINISTERED:none  DRAINS: none   LOCAL MEDICATIONS USED:  MARCAINE    and LIDOCAINE   SPECIMEN:  No Specimen  DISPOSITION OF SPECIMEN:  N/A  COUNTS:  YES  TOURNIQUET:  * No tourniquets in log *  DICTATION: Patient is a 69-year-old with scoliosis , severe stenosis, spondylolisthesis, disc herniation and severe back and left lower extremity pain and weakness with progressive foot drop  at L4/5 levels of the lumbar spine. It was elected to take him to surgery for MASPLIF L 45 level with posterolateral arthrodesis.  Procedure:   Following uncomplicated induction of GETA, and placement of electrodes for neural monitoring, patient was turned into a prone position on the Jackson tableand using AP  fluoroscopy the area of planned incision was marked, prepped with betadine scrub and Duraprep, then draped. Exposure was performed of facet joint complex at L 45 level and the MAS retractor was placed.5.5 x 40 mm cortical Nuvasive screws were placed at L 4 bilaterally according to standard landmarks using neural monitoring.  A  total laminectomy of L 4 was then performed with disarticulation of facets.  Decompression was greater than for standard PLIF procedure with drilling of extensive bony overgrowth.  This bone was saved for grafting, combined with Atrax after being run through bone mill and was placed in bone packing device.  Thorough discectomy was performed bilaterally at L 45 and the endplates were prepared for grafting.  There was a disc herniation on the left with significant left L 5 nerve root compression.  The right side had bony overgrowth over the interspace and these osteophytes were removed with chisel.  Decompression was painstakingly performed with thorough decompression of the thecal sac and bilateral L 4 and L 5 nerve roots.  23 x 12 x 12 degree cages were placed in the interspace and positioning was confirmed with AP and lateral fluoroscopy.  20 cc of autograft/Atrax was packed in the interspace medial to the second cage.   Remaining screws were placed at L 5 (6.5 x 45) and 40 mm rods were placed.   And the screws were locked and torqued.Final Xrays showed well positioned implants and screw fixation. The posterolateral region was packed with remaining 30 cc of autograft on each side of midline. The wounds were irrigated and then closed with 1, 2-0 and 3-0 Vicryl stitches. Long-acting Marcaine was injected into the subcutaneous tissues. Sterile occlusive dressing was placed with Dermabond and occlusive dressing. The patient was then extubated in the operating room and taken to recovery in stable and satisfactory condition having tolerated his operation well. Counts were correct at the end of the case.  PLAN OF CARE: Admit to inpatient     PATIENT DISPOSITION:  PACU - hemodynamically stable.   Delay start of Pharmacological VTE agent (>24hrs) due to surgical blood loss or risk of bleeding: yes

## 2015-03-11 LAB — GLUCOSE, CAPILLARY: Glucose-Capillary: 143 mg/dL — ABNORMAL HIGH (ref 65–99)

## 2015-03-11 MED ORDER — OXYCODONE-ACETAMINOPHEN 5-325 MG PO TABS: 1.0000 | ORAL_TABLET | ORAL | Status: DC | PRN

## 2015-03-11 MED ORDER — METHOCARBAMOL 500 MG PO TABS: 500.0000 mg | ORAL_TABLET | Freq: Four times a day (QID) | ORAL | Status: DC | PRN

## 2015-03-11 NOTE — Discharge Instructions (Signed)

## 2015-03-11 NOTE — Progress Notes (Signed)
Patient alert and oriented, mae's well, voiding adequate amount of urine, swallowing without difficulty, no c/o pain. Patient discharged home with family. Script and discharged instructions given to patient. Patient and family stated understanding of d/c instructions given and has an appointment with MD. 

## 2015-03-11 NOTE — Progress Notes (Signed)
PT Cancellation Note  Patient Details Name: Marcus Carlson MRN: [id number] DOB: 1945/08/17   Cancelled Treatment:    Reason Eval/Treat Not Completed: Other (comment) (Pt d/c before PT evaluation could be completed)  Michail JewelsAshley Parr PT, DPT [phone] Pager: [phone] 03/11/2015, 11:47 AM

## 2015-03-11 NOTE — Progress Notes (Signed)
Occupational Therapy Evaluation Patient Details Name: Marcus Carlson MRN: [id number] DOB: 07/04/1945 Today's Date: 03/11/2015    History of Present Illness Lumbar four-five Maximum access posterior lumbar interbody fusion    Clinical Impression   Completed all education regarding ADL and functional mobility for ADL adhering to back precautions. REviewed written handout.  Pt ready to D/C home when medically stable.     Follow Up Recommendations  No OT follow up;Supervision - Intermittent    Equipment Recommendations  None recommended by OT    Recommendations for Other Services       Precautions / Restrictions Precautions Precautions: Back Required Braces or Orthoses: Spinal Brace Spinal Brace: Applied in sitting position;Lumbar corset      Mobility Bed Mobility Overal bed mobility: Needs Assistance Bed Mobility: Sidelying to Sit   Sidelying to sit: Supervision       General bed mobility comments: vc for correct technique   Transfers Overall transfer level: Modified independent                    Balance Overall balance assessment: No apparent balance deficits (not formally assessed)                                          ADL Overall ADL's : Needs assistance/impaired                                     Functional mobility during ADLs: Supervision/safety General ADL Comments: Completed educaiton regarding compensatory techniques and use of AE and DME for ADL. Pt had difficulty with donning socks on L foot prior to surgery. Pt able to return demonstrate     Vision     Perception     Praxis      Pertinent Vitals/Pain Pain Assessment: 0-10 Pain Score: 3  Pain Location: back Pain Descriptors / Indicators: Aching Pain Intervention(s): Limited activity within patient's tolerance     Hand Dominance     Extremity/Trunk Assessment Upper Extremity Assessment Upper Extremity Assessment: Overall WFL for tasks  assessed   Lower Extremity Assessment Lower Extremity Assessment: LLE deficits/detail LLE Deficits / Details: L knee limited ROM   Cervical / Trunk Assessment Cervical / Trunk Assessment: Other exceptions (fusion)   Communication Communication Communication: No difficulties   Cognition Arousal/Alertness: Awake/alert Behavior During Therapy: WFL for tasks assessed/performed Overall Cognitive Status: Within Functional Limits for tasks assessed                     General Comments       Exercises       Shoulder Instructions      Home Living Family/patient expects to be discharged to:: Private residence Living Arrangements: Spouse/significant other Available Help at Discharge: Family;Available 24 hours/day Type of Home: House Home Access: Ramped entrance     Home Layout: One level     Bathroom Shower/Tub: Producer, television/film/video: Standard Bathroom Accessibility: Yes How Accessible: Accessible via walker Home Equipment: Walker - 2 wheels;Bedside commode;Shower seat;Shower seat - built in;Hospital bed          Prior Functioning/Environment Level of Independence: Independent             OT Diagnosis: Generalized weakness;Acute pain   OT Problem List: Decreased strength;Decreased activity tolerance;Decreased knowledge of  precautions;Decreased knowledge of use of DME or AE;Pain   OT Treatment/Interventions:      OT Goals(Current goals can be found in the care plan section) Acute Rehab OT Goals Patient Stated Goal: to go home OT Goal Formulation: All assessment and education complete, DC therapy  OT Frequency:     Barriers to D/C:            Co-evaluation              End of Session Equipment Utilized During Treatment: Back brace Nurse Communication: Mobility status  Activity Tolerance: Patient tolerated treatment well Patient left: in chair;with call bell/phone within reach   Time: 0814-0842 OT Time Calculation (min): 28  min Charges:  OT General Charges $OT Visit: 1 Procedure OT Evaluation $Initial OT Evaluation Tier I: 1 Procedure OT Treatments $Self Care/Home Management : 8-22 mins G-Codes:    Marcus Carlson,Marcus Carlson 03/11/2015, 8:44 AM   Marcus Carlson, OTR/L  [phone] 03/11/2015

## 2015-03-11 NOTE — Discharge Summary (Signed)
Physician Discharge Summary  Patient ID: Carlye GrippeJames R Lauderback MRN: [id number] DOB/AGE: 1945/10/09 69 y.o.  Admit date: 03/10/2015 Discharge date: 03/11/2015  Admission Diagnoses:  Spondylolisthesis, Lumbar region with foot drop, severe spinal stenosis, scoliosis L 45 level  Discharge Diagnoses:  Spondylolisthesis, Lumbar region with foot drop, severe spinal stenosis, scoliosis L 45 level Active Problems:   Spondylolisthesis of lumbar region   Discharged Condition: good  Hospital Course: Patient was admitted by Dr. Venetia MaxonStern who performed an L4-5 decompression and fusion. He has done well following surgery, he is up and ambulating actively in the halls. He is voiding well. He has wife have been given instructions regarding care of his dressing and subsequent wound care. They have been given instructions regarding activities following discharge. He is scheduled to see Dr. Venetia MaxonStern in follow-up in a couple weeks.  Discharge Exam: Blood pressure 102/62, pulse 93, temperature 98.7 F (37.1 C), temperature source Oral, resp. rate 20, height 6\' 3"  (1.905 m), weight 103.193 kg (227 lb 8 oz), SpO2 92 %.  Disposition: Home    Medication List    TAKE these medications        amLODipine 10 MG tablet  Commonly known as:  NORVASC  Take 10 mg by mouth daily.     aspirin 81 MG tablet  Take 81 mg by mouth daily.     atorvastatin 20 MG tablet  Commonly known as:  LIPITOR  Take 20 mg by mouth daily.     carvedilol 6.25 MG tablet  Commonly known as:  COREG  Take 6.25 mg by mouth 2 (two) times daily with a meal.     clopidogrel 75 MG tablet  Commonly known as:  PLAVIX  Take 75 mg by mouth daily.     desonide 0.05 % lotion  Commonly known as:  DESOWEN  Apply 1 application topically 2 (two) times daily. On face     docusate sodium 100 MG capsule  Commonly known as:  COLACE  Take 100 mg by mouth 2 (two) times daily.     DULoxetine 60 MG capsule  Commonly known as:  CYMBALTA  Take 60 mg by mouth  daily.     esomeprazole 40 MG capsule  Commonly known as:  NEXIUM  Take 40 mg by mouth daily at 12 noon.     fluticasone 50 MCG/ACT nasal spray  Commonly known as:  FLONASE  Place 1 spray into both nostrils 2 (two) times daily.     glimepiride 1 MG tablet  Commonly known as:  AMARYL  Take 1 mg by mouth at bedtime.     lisinopril-hydrochlorothiazide 20-12.5 MG tablet  Commonly known as:  PRINZIDE,ZESTORETIC  Take 1 tablet by mouth daily.     metFORMIN 1000 MG tablet  Commonly known as:  GLUCOPHAGE  Take 1,000 mg by mouth 2 (two) times daily with a meal.     methocarbamol 500 MG tablet  Commonly known as:  ROBAXIN  Take 1 tablet (500 mg total) by mouth every 6 (six) hours as needed for muscle spasms.     oxyCODONE-acetaminophen 5-325 MG tablet  Commonly known as:  PERCOCET/ROXICET  Take 1-2 tablets by mouth every 4 (four) hours as needed (pain).     ranitidine 150 MG capsule  Commonly known as:  ZANTAC  Take 150 mg by mouth daily as needed for heartburn.         SignedHewitt Shorts: NUDELMAN,ROBERT W 03/11/2015, 8:33 AM

## 2015-03-13 ENCOUNTER — Encounter (HOSPITAL_COMMUNITY): Payer: Self-pay | Admitting: Neurosurgery

## 2015-03-16 ENCOUNTER — Encounter (HOSPITAL_COMMUNITY): Payer: Self-pay | Admitting: Neurosurgery

## 2016-04-14 ENCOUNTER — Encounter (HOSPITAL_COMMUNITY): Payer: Self-pay | Admitting: *Deleted

## 2016-04-14 ENCOUNTER — Emergency Department (HOSPITAL_COMMUNITY)
Admission: EM | Admit: 2016-04-14 | Discharge: 2016-04-14 | Disposition: A | Discharge status: Home / Self Care | Payer: Medicare Other | Attending: Emergency Medicine | Admitting: Emergency Medicine

## 2016-04-14 DIAGNOSIS — E119 Type 2 diabetes mellitus without complications: Secondary | ICD-10-CM | POA: Insufficient documentation

## 2016-04-14 DIAGNOSIS — Z7902 Long term (current) use of antithrombotics/antiplatelets: Secondary | ICD-10-CM | POA: Insufficient documentation

## 2016-04-14 DIAGNOSIS — I2510 Atherosclerotic heart disease of native coronary artery without angina pectoris: Secondary | ICD-10-CM | POA: Diagnosis not present

## 2016-04-14 DIAGNOSIS — Z87891 Personal history of nicotine dependence: Secondary | ICD-10-CM | POA: Insufficient documentation

## 2016-04-14 DIAGNOSIS — I10 Essential (primary) hypertension: Secondary | ICD-10-CM | POA: Insufficient documentation

## 2016-04-14 DIAGNOSIS — Z7982 Long term (current) use of aspirin: Secondary | ICD-10-CM | POA: Insufficient documentation

## 2016-04-14 DIAGNOSIS — R040 Epistaxis: Secondary | ICD-10-CM | POA: Diagnosis not present

## 2016-04-14 DIAGNOSIS — Z7984 Long term (current) use of oral hypoglycemic drugs: Secondary | ICD-10-CM | POA: Insufficient documentation

## 2016-04-14 MED ORDER — PHENYLEPHRINE HCL 1 % NA SOLN
NASAL | Status: AC
  Administered 2016-04-14: 2 [drp] via NASAL
  Filled 2016-04-14: qty 15

## 2016-04-14 MED ORDER — PHENYLEPHRINE HCL 1 % NA SOLN
2.0000 [drp] | Freq: Once | NASAL | Status: AC
  Administered 2016-04-14: 2 [drp] via NASAL

## 2016-04-14 MED ORDER — LIDOCAINE VISCOUS 2 % MT SOLN
OROMUCOSAL | Status: AC
  Filled 2016-04-14: qty 15

## 2016-04-14 MED ORDER — OXYMETAZOLINE HCL 0.05 % NA SOLN: 1.0000 | Freq: Once | NASAL | Status: DC

## 2016-04-14 MED ORDER — CEPHALEXIN 500 MG PO CAPS
500.0000 mg | ORAL_CAPSULE | Freq: Once | ORAL | Status: AC
  Administered 2016-04-14: 500 mg via ORAL
  Filled 2016-04-14: qty 1

## 2016-04-14 MED ORDER — COCAINE HCL 4 % EX SOLN
4.0000 mL | Freq: Once | CUTANEOUS | Status: DC
  Filled 2016-04-14: qty 4

## 2016-04-14 MED ORDER — CEPHALEXIN 500 MG PO CAPS: 500.0000 mg | ORAL_CAPSULE | Freq: Two times a day (BID) | ORAL | 0 refills | Status: DC

## 2016-04-14 NOTE — ED Provider Notes (Signed)
Pt transferred for ENT eval. Dr. Lazarus SalinesWolicki evaluated pt in ED, appreciate assistance. His bleeding has stopped and Dr. Lazarus SalinesWolicki has discussed management at home and f/u plan. Pt discharged in satisfactory condition.   Laurence Spatesachel Morgan Shameria Trimarco, MD 04/14/16 1010

## 2016-04-14 NOTE — ED Notes (Signed)
After discharge instructions patient nose started bleeding again from left side (after not bleeding for last hour or so). Pt holding pressure now, Ward informed, at bedside

## 2016-04-14 NOTE — ED Provider Notes (Addendum)
TIME SEEN: 2:10 AM  CHIEF COMPLAINT: Left-sided nosebleed  HPI: Pt is a 71 y.o. male with history of CAD status post stent on Plavix, hypertension, diabetes who presents the emergency department with left-sided nosebleed that started 10 minutes after midnight. States that he got up from his recliner to his coffeepot ready for the morning and felt like his nose was running and noticed when he wiped his nose that was bleeding. Has been trying pressure at home without relief. Denies any injury to the nose, putting anything into his nose recently. No fevers. No rhinorrhea, cough, congestion recently. Does have a humidifier at home. Has never had to have nasal packing or cauterization of a nosebleed in the past.  ROS: See HPI Constitutional: no fever  Eyes: no drainage  ENT: no runny nose   Cardiovascular:  no chest pain  Resp: no SOB  GI: no vomiting GU: no dysuria Integumentary: no rash  Allergy: no hives  Musculoskeletal: no leg swelling  Neurological: no slurred speech ROS otherwise negative  PAST MEDICAL HISTORY/PAST SURGICAL HISTORY:  Past Medical History:  Diagnosis Date  . Arthritis   . Coronary artery disease    stent placed 2006  . Diabetes mellitus without complication (HCC)   . GERD (gastroesophageal reflux disease)   . History of hiatal hernia   . Hypertension   . Kidney stones   . Neuromuscular disorder (HCC)   . Type 2 diabetes mellitus (HCC)     MEDICATIONS:  Prior to Admission medications   Medication Sig Start Date End Date Taking? Authorizing Provider  amLODipine (NORVASC) 10 MG tablet Take 10 mg by mouth daily.   Yes Historical Provider, MD  aspirin 81 MG tablet Take 81 mg by mouth daily.   Yes Historical Provider, MD  atorvastatin (LIPITOR) 20 MG tablet Take 20 mg by mouth daily.   Yes Historical Provider, MD  carvedilol (COREG) 6.25 MG tablet Take 6.25 mg by mouth 2 (two) times daily with a meal.   Yes Historical Provider, MD  clopidogrel (PLAVIX) 75 MG  tablet Take 75 mg by mouth daily.   Yes Historical Provider, MD  desonide (DESOWEN) 0.05 % lotion Apply 1 application topically 2 (two) times daily. On face   Yes Historical Provider, MD  docusate sodium (COLACE) 100 MG capsule Take 100 mg by mouth 2 (two) times daily.   Yes Historical Provider, MD  DULoxetine (CYMBALTA) 60 MG capsule Take 60 mg by mouth daily.   Yes Historical Provider, MD  esomeprazole (NEXIUM) 40 MG capsule Take 40 mg by mouth daily at 12 noon.   Yes Historical Provider, MD  fluticasone (FLONASE) 50 MCG/ACT nasal spray Place 1 spray into both nostrils 2 (two) times daily.   Yes Historical Provider, MD  glimepiride (AMARYL) 1 MG tablet Take 1 mg by mouth at bedtime.    Yes Historical Provider, MD  lisinopril-hydrochlorothiazide (PRINZIDE,ZESTORETIC) 20-12.5 MG tablet Take 1 tablet by mouth daily.   Yes Historical Provider, MD  metFORMIN (GLUCOPHAGE) 1000 MG tablet Take 1,000 mg by mouth 2 (two) times daily with a meal.   Yes Historical Provider, MD    ALLERGIES:  Allergies  Allergen Reactions  . Ivp Dye [Iodinated Diagnostic Agents] Nausea And Vomiting    SOCIAL HISTORY:  Social History  Substance Use Topics  . Smoking status: Former Games developermoker  . Smokeless tobacco: Not on file  . Alcohol use No    FAMILY HISTORY: No family history on file.  EXAM: BP 152/91 (BP Location: Left Arm)  Pulse 106   Temp 98.4 F (36.9 C) (Oral)   Resp 18   Ht 6\' 3"  (1.905 m)   Wt 228 lb (103.4 kg)   SpO2 92%   BMI 28.50 kg/m  CONSTITUTIONAL: Alert and oriented and responds appropriately to questions. Well-appearing; well-nourished, Elderly, in no distress HEAD: Normocephalic EYES: Conjunctivae clear, PERRL, EOMI ENT: normal nose; no rhinorrhea; moist mucous membranes, active bleeding from the left nostril that is running down the posterior oropharynx. Unable to identify bleeding source at this time. NECK: Supple, no meningismus, no nuchal rigidity, no LAD  CARD: RRR; S1 and S2  appreciated; no murmurs, no clicks, no rubs, no gallops RESP: Normal chest excursion without splinting or tachypnea; breath sounds clear and equal bilaterally; no wheezes, no rhonchi, no rales, no hypoxia or respiratory distress, speaking full sentences ABD/GI: Normal bowel sounds; non-distended; soft, non-tender, no rebound, no guarding, no peritoneal signs, no hepatosplenomegaly BACK:  The back appears normal and is non-tender to palpation, there is no CVA tenderness EXT: Normal ROM in all joints; non-tender to palpation; no edema; normal capillary refill; no cyanosis, no calf tenderness or swelling    SKIN: Normal color for age and race; warm; no rash NEURO: Moves all extremities equally, sensation to light touch intact diffusely, cranial nerves II through XII intact, normal speech PSYCH: The patient's mood and manner are appropriate. Grooming and personal hygiene are appropriate.  MEDICAL DECISION MAKING: Patient here with left-sided nosebleed. Have had him blow all the clots out of his nose and have sprayed Afrin and Neo-Synephrine and his nose and we will have him hold pressure for 30 minutes and reevaluate. He is on Plavix and aspirin.  ED PROGRESS: 3:15 AM  No further bleeding after pressure, Neo-Synephrine and Afrin. Suspect possible posterior bleed. I do not see any abnormal vessels anteriorly. We will monitor patient until his son can pick him up on the emergency department.  4:30 AM  Pt's son is here to pick him up. No recurrent nosebleed. We'll discharge home with outpatient ENT follow-up. Discussed with him what to do at home with his nose began bleeding again. Recommend he do not blow his nose or put anything into his nose for the next several days. Recommend using humidifier at home and nasal saline to keep mucous membranes moist. Patient verbalizes understanding and is comfortable with this plan.  Marland KitchenEpistaxis Management Date/Time: 04/14/2016 2:52 AM Performed by: Raelyn Number Authorized by: Raelyn Number   Consent:    Consent obtained:  Verbal   Consent given by:  Patient   Risks discussed:  Bleeding, infection, nasal injury and pain   Alternatives discussed:  No treatment Anesthesia (see MAR for exact dosages):    Anesthesia method:  None Procedure details:    Treatment site:  L posterior   Treatment method:  Afrin and neosynephrine with pressure   Treatment complexity:  Extensive   Treatment episode: recurring   Post-procedure details:    Assessment:  Bleeding stopped   Patient tolerance of procedure:  Tolerated well, no immediate complications     Layla Maw Michell Kader, DO 04/14/16 0429   5:00 AM  Pt's son at bedside and and as his nurse was going over discharge instructions patient began having bleeding again. Unable to stop bleeding with pressure. We have placed a posterior double balloon Rhino Rocket with viscous lidocaine. He has received oral Keflex.  There is approximately 2-1/2 cm sticking out of his nose as he has a deviated nasal septum  and I meet significant resistance when trying to piece this posterior packing. I feel he needs a posterior tampon because I feel the bleed is posterior do not see anything bleeding anteriorly. He is still bleeding into his posterior oropharynx. We'll discuss with ENT on call. He may need transfer to [facility].   .Epistaxis Management Date/Time: 04/14/2016 5:02 AM Performed by: Raelyn Number Authorized by: Raelyn Number   Consent:    Consent obtained:  Verbal   Consent given by:  Patient   Risks discussed:  Bleeding, infection, nasal injury and pain   Alternatives discussed:  No treatment and alternative treatment Anesthesia (see MAR for exact dosages):    Anesthesia method:  None Procedure details:    Treatment site:  L posterior   Treatment method:  Nasal balloon   Treatment complexity:  Extensive   Treatment episode: recurring   Post-procedure details:    Assessment:  No  improvement  5:15 AM  D/w Dr. Lazarus Salines with ENT. We appreciate his help. He recommends transfer to [facility] where he can see the patient in the ER. Patient would like to go by private vehicle with his son which I feel is reasonable. He is hemodynamically stable and is not hemorrhaging.  Posterior packing in place with still active drainage of blood into the posterior oropharynx noted. Discussed with Dr. Manus Gunning in the emergency department who agrees to accept the patient in transfer. Patient and family told to go directly to the emergency department.   I reviewed all nursing notes, vitals, pertinent old records, EKGs, labs, imaging (as available).    Layla Maw Maisley Hainsworth, DO 04/14/16 4098    Layla Maw Tysen Roesler, DO 04/14/16 1191

## 2016-04-14 NOTE — ED Notes (Signed)
Pt left at this time with EMTALA.

## 2016-04-14 NOTE — ED Notes (Signed)
Patient given nasal spray to take home and discharge instructions reviewed

## 2016-04-14 NOTE — ED Notes (Signed)
ED Provider at bedside. 

## 2016-04-14 NOTE — Discharge Instructions (Addendum)
Please call ENT on Monday for an appointment.  If you're not able to be seen next week, you may remove the nasal packing from your nose on Thursday, January 11. Please take your antibiotics until that time.   If you began bleeding from your nose again after the packing is removed, you may blow your nose, spray Afrin into your nose and then hold pressure for 30 minutes without stopping.  Nasal Hygiene Instructions  Use saline nasal spray every 1-2 hrs while awake in both sides of your nose.  This is especially important now that we have cauterized the LEFT side, but is generally true in winter, especially since you are on anticoagulants  Run a humidifier in your bed room  Place a small amt of ointment (Neosporin, Bacitracin, Triple antibiotic, etc) morning and evening.    You may resume activities.    Continue your aspirin and Plavix  Recheck my office as needed, [phone] for an appointment.

## 2016-04-14 NOTE — Consult Note (Signed)
Marcus Carlson,  Marcus Carlson 71 y.o., male [id number]     Chief Complaint: LEFT epistaxis  HPI: 71 yo wm, on ASA + Plavix x 10+ yrs for CAD with stent placement.  Sudden onset LEFT epistaxis this AM.  Seen in AP, no site identified.  Anterior balloon pack placed with difficulty due to septal deflection.  [facility] ED for further eval.  Bleeding stopped en route.  No other unusual bleeding.    PMH: Past Medical History:  Diagnosis Date  . Arthritis   . Coronary artery disease    stent placed 2006  . Diabetes mellitus without complication (HCC)   . GERD (gastroesophageal reflux disease)   . History of hiatal hernia   . Hypertension   . Kidney stones   . Neuromuscular disorder (HCC)   . Type 2 diabetes mellitus (HCC)     Surg Hx: Past Surgical History:  Procedure Laterality Date  . CORONARY ANGIOPLASTY    . MASTOIDECTOMY     mastoid cyst removed  . MAXIMUM ACCESS (MAS)POSTERIOR LUMBAR INTERBODY FUSION (PLIF) 1 LEVEL N/A 03/10/2015   Procedure: Lumbar four-five Maximum access posterior lumbar interbody fusion;  Surgeon: Maeola HarmanJoseph Stern, MD;  Location: [facility];  Service: Neurosurgery;  Laterality: N/A;  Lumbar four-five Maximum access posterior lumbar interbody fusion  . righ arm surgery    . TONSILLECTOMY      FHx:  No family history on file. SocHx:  reports that he has quit smoking. He does not have any smokeless tobacco history on file. He reports that he does not drink alcohol or use drugs.  ALLERGIES:  Allergies  Allergen Reactions  . Ivp Dye [Iodinated Diagnostic Agents] Nausea And Vomiting     (Not in a hospital admission)  No results found for this or any previous visit (from the past 48 hour(s)). No results found.  ROS: no chest pain or SOB.  Blood pressure 148/88, pulse 100, temperature 98 F (36.7 C), resp. rate 16, height 6\' 3"  (1.905 m), weight 103.4 kg (228 lb), SpO2 95 %.  PHYSICAL EXAM: Overall appearance:  Alert, energetic, talkative.  Balloon pack sticking out  of LEFT nose 3 cm.   Head:  NCAT Ears:  clear Nose:  Pack removed.  Blood against mid septum on LEFT.  RIGHT slightly dry. Oral Cavity:  Clear, teeth in fair to good repair Oral Pharynx/Hypopharynx/Larynx:  No active blood Neuro:  Grossly intact Neck:  No nodes    Assessment/Plan LEFT epistaxis  Plan:  Removed pack as above. Anesthetized anterior LEFT nose with 4% cocaine solution.  Small arteriolar bleeding site on mid septum identified and silver nitrate cauterized.  Good control and well tolerated.    Nasal hygiene measures written and given.  Recheck my office prn.  Routine activities.  Continue ASA and Plavix.  Marcus Carlson, Marcus Carlson 04/14/2016, 9:41 AM

## 2016-04-14 NOTE — ED Notes (Signed)
Rapid Rhino and Viscous Lidocaine at bedside

## 2016-04-14 NOTE — ED Provider Notes (Signed)
Medical screening exam performed.  Patient transferred for ENT evaluation for persistent bleeding and presumed posterior nosebleed. I did evaluate the patient. Packing is in place. He does not have any significant active bleeding. Patient is angry because ENT was not here waiting for him, stating "Oh, so you make the customer wait."  Attempted to explain to the patient that Dr. Lazarus SalinesWolicki would be in shortly, patient continued to be aggressive and angry.   Face to face Exam: HEENT - PERRLA, packing in place left malar, no active bleeding Neuro - alert, oriented x3  Plan: ENT eval.   Gilda Creasehristopher J Pollina, MD 04/14/16 [phone]

## 2016-04-14 NOTE — ED Triage Notes (Signed)
Pt states he got up and went into the kitchen. Pt states he thought he had some sinus drainage but actually his nose was bleeding. Pt states it has been bleeding for 1 1/2 hours. Pt takes aspirin and plavix daily.

## 2018-02-09 ENCOUNTER — Other Ambulatory Visit: Payer: Self-pay | Admitting: Neurosurgery

## 2018-04-24 ENCOUNTER — Inpatient Hospital Stay: Admit: 2018-04-24 | Payer: Medicare Other | Admitting: Neurosurgery

## 2018-04-24 SURGERY — ANTERIOR LATERAL LUMBAR FUSION 2 LEVELS
Anesthesia: General | Laterality: Right

## 2019-01-18 ENCOUNTER — Other Ambulatory Visit: Payer: Self-pay | Admitting: Neurosurgery

## 2019-01-19 ENCOUNTER — Other Ambulatory Visit: Payer: Self-pay | Admitting: Neurosurgery

## 2019-01-19 DIAGNOSIS — M5416 Radiculopathy, lumbar region: Secondary | ICD-10-CM

## 2019-01-19 DIAGNOSIS — S0540XA Penetrating wound of orbit with or without foreign body, unspecified eye, initial encounter: Secondary | ICD-10-CM

## 2019-01-21 ENCOUNTER — Ambulatory Visit: Admission: RE | Admit: 2019-01-21 | Payer: Medicare Other | Source: Ambulatory Visit

## 2019-02-10 ENCOUNTER — Other Ambulatory Visit: Payer: Self-pay | Admitting: Neurosurgery

## 2019-03-11 NOTE — H&P (Signed)
Patient ID:   [id number] Patient: Marcus Carlson  Date of Birth: [birth year] Visit Type: Office Visit   Date: 02/10/2019 10:00 AM Provider: Marchia Meiers. Vertell Limber MD   This 73 year old male presents for back pain.  HISTORY OF PRESENT ILLNESS:  1.  back pain  Patient returns after reporting increased lumbar pain x1 month with left buttock numbness.  Standing more than 3 minutes elicits pain.  Patient once unable to proceed with surgery in January as he was caring for his wife at the time.  Presently, he reports his symptoms may mandate surgery.  He takes no pain medications.  MRI and x-ray on canopy  The patient has been having more trouble than previously and says he is now not able to tolerate this all.  I obtained a new MRI of his lumbar spine which shows progressive worsening at the L1-2 level with retrolisthesis and a disc herniation on the left.  He gets relief when he lays down or sits but cannot tolerate standing for any length time.  He does not appear to have focal weakness on examination.         Medical/Surgical/Interim History Reviewed, no change.  Last detailed document date:02/22/2015.     PAST MEDICAL HISTORY, SURGICAL HISTORY, FAMILY HISTORY, SOCIAL HISTORY AND REVIEW OF SYSTEMS I have reviewed the patient's past medical, surgical, family and social history as well as the comprehensive review of systems as included on the [facility] history form dated 06/11/2017, which I have signed.  Family History:  Reviewed, no changes.  Last detailed document date:02/22/2015.   Social History: Reviewed, no changes. Last detailed document date: 02/22/2015.    MEDICATIONS: (added, continued or stopped this visit) Started Medication Directions Instruction Stopped   aspirin 81 mg tablet,delayed release take 1 tablet by oral route  every day     Cymbalta take 1 capsule by oral route  every day    02/10/2019 hydrocodone 5 mg-acetaminophen 325 mg tablet  take 1 tablet by oral route  every 6 hours as needed for pain     metformin  ORAL Take 1 tablet by mouth daily     simvastatin take 1 tablet by oral route  every day in the evening       ALLERGIES: Ingredient Reaction Medication Name Comment  IOVERSOL Nausea/Vomiting     Reviewed, no changes.    PHYSICAL EXAM:   Vitals Date Temp F BP Pulse Ht In Wt Lb BMI BSA Pain Score  02/10/2019 96.6 112/70 99 75 216.4 27.05  8/10      IMPRESSION:   Severe low back and lower extremity pain with standing.  Patient has degeneration and foraminal stenosis of the lumbar spine at the L1-2, L2-3, L3-4 levels  PLAN:  Proceed with right XL IF L1-2, L2-3, L3-4 levels with percutaneous pedicle screw fixation and exploration of prior fusion at the L4-5 level with pedicle screw fixation.  Patient wants to go ahead with this in December and this has been scheduled for Friday December 11 at [facility].  He already has an LSO brace.  Risks and benefits were discussed in detail with patient and he wishes to proceed with surgery.  Orders: Diagnostic Procedures: Assessment Procedure  M48.062 Lumbar Spine- AP/Lat  Z60.109 Lumbar Spine- AP/Lat/Flex/Ex  Instruction(s)/Education: Assessment Instruction  [phone] Dietary management education, guidance, and counseling   Completed Orders (this encounter) Order Details Reason Side Interpretation Result Initial Treatment Date Region  Lumbar Spine- AP/Lat/Flex/Ex  02/10/2019 All Levels to All Levels  Dietary management education, guidance, and counseling Encouraged patient to eat well balanced diet.         Assessment/Plan   # Detail Type Description   1. Assessment Disc displacement, lumbar (M51.26).       2. Assessment Lumbar radiculopathy (M54.16).       3. Assessment Low back pain, unspecified back pain laterality, with sciatica presence unspecified (M54.5).       4. Assessment Spondylolisthesis, lumbar region (M43.16).       5. Assessment  Lumbar foraminal stenosis (M48.061).       6. 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       7. Assessment Body mass index (BMI) 27.0-27.9, adult (G99.24).   Plan Orders Today's instructions / counseling include(s) Dietary management education, guidance, and counseling. Clinical information/comments: Encouraged patient to eat well balanced diet.         Pain Management Plan Pain Scale: 8/10. Method: Numeric Pain Intensity Scale. Location: back. Onset: 03/13/2015. Duration: varies. Quality: discomforting. Pain management follow-up plan of care: Patient will continue medication management.     MEDICATIONS PRESCRIBED TODAY    Rx Quantity Refills  HYDROCODONE-ACETAMINOPHEN 5 mg-325 mg  60 0            Provider:  Danae Orleans. Venetia Maxon MD  02/13/2019 01:20 PM    Dictation edited by: Danae Orleans. Venetia Maxon    CC Providers: PCP  None   Collene Leyden  [address] [place], [place] [address]-               Electronically signed by Danae Orleans Venetia Maxon MD on 02/13/2019 01:20 PM

## 2019-03-15 NOTE — Pre-Procedure Instructions (Signed)
[place], [place] - [address] [place] [address] Phone: [phone] Fax: [phone]  [facility] - [address], [place] [address] Phone: [phone] Fax: [phone]      Your procedure is scheduled on Friday December 11th.  Report to [facility] Main Entrance "A" at 5:30 A.M., and check in at the Admitting office.  Call this number if you have problems the morning of surgery:  [phone]  Call [phone] if you have any questions prior to your surgery date Monday-Friday 8am-4pm    Remember:  Do not eat or drink after midnight the night before your surgery    Take these medicines the morning of surgery with A SIP OF WATER  carvedilol (COREG)  DULoxetine (CYMBALTA) ipratropium (ATROVENT) pantoprazole (PROTONIX)  Follow your surgeon's instructions on when to stop clopidogrel (PLAVIX).  If no instructions were given by your surgeon then you will need to call the office to get those instructions.      As of today, STOP taking any Aspirin (unless otherwise instructed by your surgeon), Aleve, Naproxen, Ibuprofen, Motrin, Advil, Goody's, BC's, all herbal medications, fish oil, and all vitamins.   HOW TO MANAGE YOUR DIABETES BEFORE AND AFTER SURGERY  Why is it important to control my blood sugar before and after surgery? . Improving blood sugar levels before and after surgery helps healing and can limit problems. . A way of improving blood sugar control is eating a healthy diet by: o  Eating less sugar and carbohydrates o  Increasing activity/exercise o  Talking with your doctor about reaching your blood sugar goals . High blood sugars (greater than 180 mg/dL) can raise your risk of infections and slow your recovery, so you will need to focus on controlling your diabetes during the weeks before surgery. . Make sure that the doctor who takes care  of your diabetes knows about your planned surgery including the date and location.  How do I manage my blood sugar before surgery? . Check your blood sugar at least 4 times a day, starting 2 days before surgery, to make sure that the level is not too high or low. o Check your blood sugar the morning of your surgery when you wake up and every 2 hours until you get to the Short Stay unit. . If your blood sugar is less than 70 mg/dL, you will need to treat for low blood sugar: o Do not take insulin. o Treat a low blood sugar (less than 70 mg/dL) with  cup of clear juice (cranberry or apple), 4 glucose tablets, OR glucose gel. Recheck blood sugar in 15 minutes after treatment (to make sure it is greater than 70 mg/dL). If your blood sugar is not greater than 70 mg/dL on recheck, call [phone] o  for further instructions. . Report your blood sugar to the short stay nurse when you get to Short Stay.  . If you are admitted to the hospital after surgery: o Your blood sugar will be checked by the staff and you will probably be given insulin after surgery (instead of oral diabetes medicines) to make sure you have good blood sugar levels. o The goal for blood sugar control after surgery is 80-180 mg/dL.     WHAT DO I DO ABOUT MY DIABETES MEDICATION?   Marland Kitchen Do not take oral diabetes medicines (pills): metFORMIN (GLUCOPHAGE) the morning of surgery.  The Morning of Surgery  Do not wear jewelry, make-up or nail polish.  Do not wear lotions, powders, or perfumes/colognes, or deodorant  Do not shave 48 hours prior to surgery.  Men may shave face and neck.  Do not bring valuables to the hospital.  [facility] is not responsible for any belongings or valuables.  If you are a smoker, DO NOT Smoke 24 hours prior to surgery  If you wear a CPAP at night please bring your mask, tubing, and machine the morning of surgery   Remember that you must have someone to transport you home after your surgery,  and remain with you for 24 hours if you are discharged the same day.   Please bring cases for contacts, glasses, hearing aids, dentures or bridgework because it cannot be worn into surgery.    Leave your suitcase in the car.  After surgery it may be brought to your room.  For patients admitted to the hospital, discharge time will be determined by your treatment team.  Patients discharged the day of surgery will not be allowed to drive home.    Special instructions:   [place]- Preparing For Surgery  Before surgery, you can play an important role. Because skin is not sterile, your skin needs to be as free of germs as possible. You can reduce the number of germs on your skin by washing with CHG (chlorahexidine gluconate) Soap before surgery.  CHG is an antiseptic cleaner which kills germs and bonds with the skin to continue killing germs even after washing.    Oral Hygiene is also important to reduce your risk of infection.  Remember - BRUSH YOUR TEETH THE MORNING OF SURGERY WITH YOUR REGULAR TOOTHPASTE  Please do not use if you have an allergy to CHG or antibacterial soaps. If your skin becomes reddened/irritated stop using the CHG.  Do not shave (including legs and underarms) for at least 48 hours prior to first CHG shower. It is OK to shave your face.  Please follow these instructions carefully.   1. Shower the NIGHT BEFORE SURGERY and the MORNING OF SURGERY with CHG Soap.   2. If you chose to wash your hair, wash your hair first as usual with your normal shampoo.  3. After you shampoo, rinse your hair and body thoroughly to remove the shampoo.  4. Use CHG as you would any other liquid soap. You can apply CHG directly to the skin and wash gently with a scrungie or a clean washcloth.   5. Apply the CHG Soap to your body ONLY FROM THE NECK DOWN.  Do not use on open wounds or open sores. Avoid contact with your eyes, ears, mouth and genitals (private parts). Wash Face and genitals  (private parts)  with your normal soap.   6. Wash thoroughly, paying special attention to the area where your surgery will be performed.  7. Thoroughly rinse your body with warm water from the neck down.  8. DO NOT shower/wash with your normal soap after using and rinsing off the CHG Soap.  9. Pat yourself dry with a CLEAN TOWEL.  10. Wear CLEAN PAJAMAS to bed the night before surgery, wear comfortable clothes the morning of surgery  11. Place CLEAN SHEETS on your bed the night of your first shower and DO NOT SLEEP WITH PETS.    Day of Surgery:  Please shower the morning of surgery with the CHG soap Do not apply any deodorants/lotions. Please wear clean clothes to the  hospital/surgery center.   Remember to brush your teeth WITH YOUR REGULAR TOOTHPASTE.   Please read over the following fact sheets that you were given.

## 2019-03-16 ENCOUNTER — Other Ambulatory Visit: Payer: Self-pay

## 2019-03-16 ENCOUNTER — Encounter (HOSPITAL_COMMUNITY): Payer: Self-pay

## 2019-03-16 ENCOUNTER — Encounter (HOSPITAL_COMMUNITY)
Admission: RE | Admit: 2019-03-16 | Discharge: 2019-03-16 | Disposition: A | Discharge status: Home / Self Care | Payer: Medicare Other | Source: Ambulatory Visit | Attending: Neurosurgery | Admitting: Neurosurgery

## 2019-03-16 ENCOUNTER — Other Ambulatory Visit (HOSPITAL_COMMUNITY)
Admission: RE | Admit: 2019-03-16 | Discharge: 2019-03-16 | Disposition: A | Discharge status: Home / Self Care | Payer: Medicare Other | Source: Ambulatory Visit | Attending: Neurosurgery | Admitting: Neurosurgery

## 2019-03-16 DIAGNOSIS — I2510 Atherosclerotic heart disease of native coronary artery without angina pectoris: Secondary | ICD-10-CM | POA: Insufficient documentation

## 2019-03-16 DIAGNOSIS — K219 Gastro-esophageal reflux disease without esophagitis: Secondary | ICD-10-CM | POA: Insufficient documentation

## 2019-03-16 DIAGNOSIS — E118 Type 2 diabetes mellitus with unspecified complications: Secondary | ICD-10-CM | POA: Insufficient documentation

## 2019-03-16 DIAGNOSIS — Z7984 Long term (current) use of oral hypoglycemic drugs: Secondary | ICD-10-CM | POA: Insufficient documentation

## 2019-03-16 DIAGNOSIS — Z87891 Personal history of nicotine dependence: Secondary | ICD-10-CM | POA: Insufficient documentation

## 2019-03-16 DIAGNOSIS — Z79899 Other long term (current) drug therapy: Secondary | ICD-10-CM | POA: Insufficient documentation

## 2019-03-16 DIAGNOSIS — Z20828 Contact with and (suspected) exposure to other viral communicable diseases: Secondary | ICD-10-CM | POA: Insufficient documentation

## 2019-03-16 DIAGNOSIS — Z01812 Encounter for preprocedural laboratory examination: Secondary | ICD-10-CM | POA: Insufficient documentation

## 2019-03-16 DIAGNOSIS — K449 Diaphragmatic hernia without obstruction or gangrene: Secondary | ICD-10-CM | POA: Insufficient documentation

## 2019-03-16 DIAGNOSIS — Z7901 Long term (current) use of anticoagulants: Secondary | ICD-10-CM | POA: Insufficient documentation

## 2019-03-16 DIAGNOSIS — I10 Essential (primary) hypertension: Secondary | ICD-10-CM | POA: Insufficient documentation

## 2019-03-16 DIAGNOSIS — M48062 Spinal stenosis, lumbar region with neurogenic claudication: Secondary | ICD-10-CM | POA: Insufficient documentation

## 2019-03-16 DIAGNOSIS — Z7902 Long term (current) use of antithrombotics/antiplatelets: Secondary | ICD-10-CM | POA: Insufficient documentation

## 2019-03-16 HISTORY — DX: Personal history of urinary calculi: Z87.442

## 2019-03-16 LAB — BASIC METABOLIC PANEL
Anion gap: 11 (ref 5–15)
BUN: 13 mg/dL (ref 8–23)
CO2: 27 mmol/L (ref 22–32)
Calcium: 8.6 mg/dL — ABNORMAL LOW (ref 8.9–10.3)
Chloride: 102 mmol/L (ref 98–111)
Creatinine, Ser: 1.37 mg/dL — ABNORMAL HIGH (ref 0.61–1.24)
GFR calc Af Amer: 58 mL/min — ABNORMAL LOW (ref >60)
GFR calc non Af Amer: 50 mL/min — ABNORMAL LOW (ref >60)
Glucose, Bld: 122 mg/dL — ABNORMAL HIGH (ref 70–99)
Potassium: 4 mmol/L (ref 3.5–5.1)
Sodium: 140 mmol/L (ref 135–145)

## 2019-03-16 LAB — CBC
HCT: 42.4 % (ref 39.0–52.0)
Hemoglobin: 13.2 g/dL (ref 13.0–17.0)
MCH: 28.9 pg (ref 26.0–34.0)
MCHC: 31.1 g/dL (ref 30.0–36.0)
MCV: 92.8 fL (ref 80.0–100.0)
Platelets: 321 10*3/uL (ref 150–400)
RBC: 4.57 MIL/uL (ref 4.22–5.81)
RDW: 13.2 % (ref 11.5–15.5)
WBC: 5.9 10*3/uL (ref 4.0–10.5)
nRBC: 0 % (ref 0.0–0.2)

## 2019-03-16 LAB — HEMOGLOBIN A1C
Hgb A1c MFr Bld: 7.3 % — ABNORMAL HIGH (ref 4.8–5.6)
Mean Plasma Glucose: 162.81 mg/dL

## 2019-03-16 LAB — TYPE AND SCREEN
ABO/RH(D): A POS
Antibody Screen: NEGATIVE

## 2019-03-16 LAB — SURGICAL PCR SCREEN
MRSA, PCR: NEGATIVE
Staphylococcus aureus: NEGATIVE

## 2019-03-16 LAB — GLUCOSE, CAPILLARY: Glucose-Capillary: 112 mg/dL — ABNORMAL HIGH (ref 70–99)

## 2019-03-16 NOTE — Progress Notes (Signed)
PCP - Dr. Horald Chestnut Vasireddy Cardiologist - Dr. Sabra Heck, [facility]   Chest x-ray - N/A EKG - "last week, had cardiac clearance" records requested Stress Test - "last week, had cardiac clearance" records requested ECHO - "last week, had cardiac clearance" records requested Cardiac Cath - 2006  Sleep Study - denies CPAP -   Fasting Blood Sugar - pt states he does not check CBG's  Blood Thinner Instructions: last dose of Plavix 03/14/19 Aspirin Instructions: Patient instructed to hold all Aspirin, NSAID's, herbal medications, fish oil and vitamins 7 days prior to surgery.   ERAS Protcol - N/A PRE-SURGERY Ensure or G2- N/A  COVID TEST- 03/16/19 at [facility]. Pt instructed to inform security they are having surgery and need to be in the right hand lane. Aware if they arrive at large "white tent", to redirect to the surgical line. Patient educated to quarantine at home following covid test until DOS.    Anesthesia review: cardiac history/records requested  Patient denies shortness of breath, fever, cough and chest pain at PAT appointment   All instructions explained to the patient, with a verbal understanding of the material. Patient agrees to go over the instructions while at home for a better understanding. Patient also instructed to self quarantine after being tested for COVID-19. The opportunity to ask questions was provided.

## 2019-03-17 LAB — NOVEL CORONAVIRUS, NAA (HOSP ORDER, SEND-OUT TO REF LAB; TAT 18-24 HRS): SARS-CoV-2, NAA: NOT DETECTED

## 2019-03-17 NOTE — Progress Notes (Addendum)
Anesthesia Chart Review:  Case: [id number] Date/Time: 03/19/19 0715   Procedures:      Right Lumbar 1-2 Lumbar 2-3 Lumbar 3-4 Anterolateral lumbar interbody fusion with percutaneous pedicle screws, exploration and revision of adjacent level fusion (Right ) - Right Lumbar 1-2 Lumbar 2-3 Lumbar 3-4 Anterolateral lumbar interbody fusion with percutaneous pedicle screws, exploration and revision of adjacent level fusion     LUMBAR PERCUTANEOUS PEDICLE SCREW 3 LEVEL (N/A )   Anesthesia type: General   Pre-op diagnosis: Spinal stenosis of lumbar region with neurogenic claudication   Location: [facility] OR ROOM 21 / [facility]   Surgeon: Erline Levine, MD      DISCUSSION: Patient is a 73 year old male scheduled for the above procedure.  History includes former smoker, CAD (s/p PCI 2006), HTN, DM2, GERD, hiatal hernia, neuromuscular disorder (not specified), s/p L4-5 PLIF 03/10/15.  Requested cardiology records from Dr. Orpah Greek on 03/17/19 AM, but I did receive signed clearance note dated 02/22/19. Patient was classified as "Moderate Risk" and permission to temporarily hold Plavix for 5 days prior to surgery.  Reported last Plavix 03/14/2019.  03/16/19 COVID-19 test negative.  Will follow-up with Dr. Ammie Ferrier office if records not received by 03/18/19 AM.  ADDENDUM 03/18/19 10:54 AM: Records received from Dr. Orpah Greek including recent office visit, EKG, stress, and echo (results outline below).  Patient felt okay to proceed with surgery following a low risk stress test on 03/10/19.    VS: BP 134/83   Pulse 87   Temp 36.8 C (Oral)   Resp 16   Ht 6\' 3"  (1.905 m)   Wt 99.8 kg   SpO2 93%   BMI 27.51 kg/m   PROVIDERS: Vasireddy, Lanetta Inch, MD his PCP Orpah Greek, MD is cardiologist ([facility])   LABS: Labs reviewed: Acceptable for surgery. (all labs ordered are listed, but only abnormal results are displayed)  Labs Reviewed  GLUCOSE, CAPILLARY - Abnormal; Notable  for the following components:      Result Value   Glucose-Capillary 112 (*)    All other components within normal limits  HEMOGLOBIN A1C - Abnormal; Notable for the following components:   Hgb A1c MFr Bld 7.3 (*)    All other components within normal limits  BASIC METABOLIC PANEL - Abnormal; Notable for the following components:   Glucose, Bld 122 (*)    Creatinine, Ser 1.37 (*)    Calcium 8.6 (*)    GFR calc non Af Amer 50 (*)    GFR calc Af Amer 58 (*)    All other components within normal limits  SURGICAL PCR SCREEN  CBC  TYPE AND SCREEN     IMAGES: MRI L-spine 02/04/19 ([facility]): Impression: 1.  Interval worsening of disc protrusion at L1-2 with a small disc fragment on the left side of the neural canal causing impingement of the descending left-sided roots in the cauda equina. 2.  Redemonstration of central spinal stenosis at L3-4 due to bulging annulus, calcification of ligamentum flavum and posterior hypertrophic changes.  There is mass-effect on the descending roots in the cauda equina at this level without definite impingement. 3.  Redemonstration of tiny right-sided disc protrusion at L4-5 with mass-effect on the descending right-sided roots in the cauda equina without impingement. 4.  Left foraminal stenosis L1-2 with impingement of the exiting left L1 nerve root. 5.  Left-sided foraminal stenosis at L3-4.  Impingement of the exiting left L3 nerve root cannot be excluded.   EKG: 03/01/19 (  [facility]): Sinus rhythm.  Normal ECG. - Low r wave in aVL. Insignificant Q waves in inferior leads.    CV:  Nuclear stress test 03/10/19 ([facility]):  On the Cardiolite portion of the test, there was some mild anterior hypokinesia.  The summed rest score was 7 and the summed stress score was 2.  There was mild REM motion artifact.  No extracardiac lung structure abnormalities were seen.  The ejection fraction was 55%. The TID index was  0.94 with normal being up to 1.22.  No reversible segmental perfusion defects were seen so this would be a low risk scan. Conclusion:  This study is consistent with a low risk for the presence of functionally significant CAD.    Summed rest = 7, Summed stress = 2. TID index 0.99.  Normal overall left ventricular contraction.  No reversible ischemic perfusion defects.  Low risk scan.  Calculated LVEF 55%.  Echo 03/02/19 ([facility]): Conclusions: 1.  Normal left ventricular systolic function.  LVEF 55 to 60%. 2.  Normal right ventricular function. 3.  Moderate left ventricular hypertrophy. 4.  Stage I diastolic dysfunction with impaired left ventricular relaxation pattern. 5.  Normal chamber dimensions. 6.  Valvular and Doppler imaging demonstrates mild aortic insufficiency. 6.  Normal estimated PA systolic pressure. 7.  No pericardial effusion. 8.  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 Comparison: No significant change compared to prior study. Recommendations: Repeat study is recommended in 2 to 3 years.   Past Medical History:  Diagnosis Date  . Arthritis   . Coronary artery disease    stent placed 2006  . Diabetes mellitus without complication (HCC)   . GERD (gastroesophageal reflux disease)   . History of hiatal hernia   . History of kidney stones   . Hypertension   . Kidney stones   . Neuromuscular disorder (HCC)   . Type 2 diabetes mellitus (HCC)     Past Surgical History:  Procedure Laterality Date  . CORONARY ANGIOPLASTY    . MASTOIDECTOMY     mastoid cyst removed  . MAXIMUM ACCESS (MAS)POSTERIOR LUMBAR INTERBODY FUSION (PLIF) 1 LEVEL N/A 03/10/2015   Procedure: Lumbar four-five Maximum access posterior lumbar interbody fusion;  Surgeon: Maeola Harman, MD;  Location: [facility];  Service: Neurosurgery;  Laterality: N/A;  Lumbar four-five Maximum access posterior lumbar interbody fusion  . righ arm surgery    . TONSILLECTOMY       MEDICATIONS: . atorvastatin (LIPITOR) 40 MG tablet  . carvedilol (COREG) 3.125 MG tablet  . cephALEXin (KEFLEX) 500 MG capsule  . clopidogrel (PLAVIX) 75 MG tablet  . desonide (DESOWEN) 0.05 % lotion  . DULoxetine (CYMBALTA) 60 MG capsule  . glimepiride (AMARYL) 2 MG tablet  . ipratropium (ATROVENT) 0.06 % nasal spray  . lisinopril-hydrochlorothiazide (PRINZIDE,ZESTORETIC) 20-12.5 MG tablet  . metFORMIN (GLUCOPHAGE) 500 MG tablet  . pantoprazole (PROTONIX) 40 MG tablet   No current facility-administered medications for this encounter.     Shonna Chock, PA-C Surgical Short Stay/Anesthesiology [facility] Phone [phone] [facility] Phone [phone] 03/17/2019 4:54 PM

## 2019-03-18 NOTE — Anesthesia Preprocedure Evaluation (Addendum)
Anesthesia Evaluation  Patient identified by MRN, date of birth, ID band Patient awake    Reviewed: Allergy & Precautions, NPO status , Patient's Chart, lab work & pertinent test results  History of Anesthesia Complications Negative for: history of anesthetic complications  Airway Mallampati: II  TM Distance: >3 FB Neck ROM: Full    Dental  (+) Dental Advisory Given, Upper Dentures, Lower Dentures   Pulmonary neg shortness of breath, neg sleep apnea, neg COPD, neg recent URI, former smoker,    breath sounds clear to auscultation       Cardiovascular hypertension, Pt. on medications (-) angina+ CAD and + Cardiac Stents   Rhythm:Regular  2006 LAD stent   Neuro/Psych neg Seizures  Neuromuscular disease negative psych ROS   GI/Hepatic hiatal hernia, GERD  Medicated and Controlled,  Endo/Other  diabetes  Renal/GU Renal disease     Musculoskeletal  (+) Arthritis ,   Abdominal   Peds  Hematology negative hematology ROS (+) Plavix held 12/6   Anesthesia Other Findings Nuclear stress test 03/10/19 ([facility]):  On the Cardiolite portion of the test, there was some mild anterior hypokinesia.  The summed rest score was 7 and the summed stress score was 2.  There was mild REM motion artifact.  No extracardiac lung structure abnormalities were seen.  The ejection fraction was 55%. The TID index was 0.94 with normal being up to 1.22.  No reversible segmental perfusion defects were seen so this would be a low risk scan. Conclusion:  This study is consistent with a low risk for the presence of functionally significant CAD.    Summed rest = 7, Summed stress = 2. TID index 0.99.  Normal overall left ventricular contraction.  No reversible ischemic perfusion defects.  Low risk scan.  Calculated LVEF 55%.  Echo 03/02/19 ([facility]): Conclusions: 1.  Normal left ventricular systolic  function.  LVEF 55 to 60%. 2.  Normal right ventricular function. 3.  Moderate left ventricular hypertrophy. 4.  Stage I diastolic dysfunction with impaired left ventricular relaxation pattern. 5.  Normal chamber dimensions. 6.  Valvular and Doppler imaging demonstrates mild aortic insufficiency. 6.  Normal estimated PA systolic pressure. 7.  No pericardial effusion. 8.  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 Comparison: No significant change compared to prior study. Recommendations: Repeat study is recommended in 2 to 3 years.  Reproductive/Obstetrics                             Anesthesia Physical Anesthesia Plan  ASA: II  Anesthesia Plan: General   Post-op Pain Management:    Induction: Intravenous  PONV Risk Score and Plan: 2 and Ondansetron and Dexamethasone  Airway Management Planned: Oral ETT  Additional Equipment: None  Intra-op Plan:   Post-operative Plan: Extubation in OR  Informed Consent: I have reviewed the patients History and Physical, chart, labs and discussed the procedure including the risks, benefits and alternatives for the proposed anesthesia with the patient or authorized representative who has indicated his/her understanding and acceptance.     Dental advisory given  Plan Discussed with: CRNA and Surgeon  Anesthesia Plan Comments: (PAT note written by Myra Gianotti, PA-C. )       Anesthesia Quick Evaluation

## 2019-03-19 ENCOUNTER — Encounter (HOSPITAL_COMMUNITY): Payer: Self-pay | Admitting: Neurosurgery

## 2019-03-19 ENCOUNTER — Inpatient Hospital Stay (HOSPITAL_COMMUNITY)
Admission: RE | Admit: 2019-03-19 | Discharge: 2019-03-20 | DRG: 455 | Disposition: A | Discharge status: Home / Self Care | Payer: Medicare Other | Attending: Neurosurgery | Admitting: Neurosurgery

## 2019-03-19 ENCOUNTER — Inpatient Hospital Stay (HOSPITAL_COMMUNITY): Payer: Medicare Other

## 2019-03-19 ENCOUNTER — Inpatient Hospital Stay (HOSPITAL_COMMUNITY): Payer: Medicare Other | Admitting: Vascular Surgery

## 2019-03-19 ENCOUNTER — Other Ambulatory Visit: Payer: Self-pay

## 2019-03-19 ENCOUNTER — Encounter (HOSPITAL_COMMUNITY)
Admission: RE | Disposition: A | Discharge status: Home / Self Care | Payer: Self-pay | Source: Home / Self Care | Attending: Neurosurgery

## 2019-03-19 ENCOUNTER — Inpatient Hospital Stay (HOSPITAL_COMMUNITY): Payer: Medicare Other | Admitting: Registered Nurse

## 2019-03-19 DIAGNOSIS — Z79899 Other long term (current) drug therapy: Secondary | ICD-10-CM | POA: Diagnosis not present

## 2019-03-19 DIAGNOSIS — M48062 Spinal stenosis, lumbar region with neurogenic claudication: Secondary | ICD-10-CM | POA: Diagnosis present

## 2019-03-19 DIAGNOSIS — M4316 Spondylolisthesis, lumbar region: Secondary | ICD-10-CM | POA: Diagnosis present

## 2019-03-19 DIAGNOSIS — Z7982 Long term (current) use of aspirin: Secondary | ICD-10-CM

## 2019-03-19 DIAGNOSIS — Z20828 Contact with and (suspected) exposure to other viral communicable diseases: Secondary | ICD-10-CM | POA: Diagnosis present

## 2019-03-19 DIAGNOSIS — M4726 Other spondylosis with radiculopathy, lumbar region: Secondary | ICD-10-CM | POA: Diagnosis present

## 2019-03-19 DIAGNOSIS — M5116 Intervertebral disc disorders with radiculopathy, lumbar region: Secondary | ICD-10-CM | POA: Diagnosis present

## 2019-03-19 DIAGNOSIS — Z91041 Radiographic dye allergy status: Secondary | ICD-10-CM | POA: Diagnosis not present

## 2019-03-19 DIAGNOSIS — Z419 Encounter for procedure for purposes other than remedying health state, unspecified: Secondary | ICD-10-CM

## 2019-03-19 DIAGNOSIS — Z888 Allergy status to other drugs, medicaments and biological substances status: Secondary | ICD-10-CM

## 2019-03-19 HISTORY — PX: ANTERIOR LAT LUMBAR FUSION: SHX1168

## 2019-03-19 HISTORY — PX: LUMBAR PERCUTANEOUS PEDICLE SCREW 3 LEVEL: SHX5562

## 2019-03-19 LAB — GLUCOSE, CAPILLARY
Glucose-Capillary: 126 mg/dL — ABNORMAL HIGH (ref 70–99)
Glucose-Capillary: 171 mg/dL — ABNORMAL HIGH (ref 70–99)
Glucose-Capillary: 219 mg/dL — ABNORMAL HIGH (ref 70–99)
Glucose-Capillary: 245 mg/dL — ABNORMAL HIGH (ref 70–99)

## 2019-03-19 SURGERY — ANTERIOR LATERAL LUMBAR FUSION 3 LEVELS
Anesthesia: General | Site: Spine Lumbar | Laterality: Right

## 2019-03-19 MED ORDER — FENTANYL CITRATE (PF) 100 MCG/2ML IJ SOLN
INTRAMUSCULAR | Status: AC
  Filled 2019-03-19: qty 2

## 2019-03-19 MED ORDER — PANTOPRAZOLE SODIUM 40 MG PO TBEC
40.0000 mg | DELAYED_RELEASE_TABLET | Freq: Every day | ORAL | Status: DC
  Administered 2019-03-20: 10:00:00 40 mg via ORAL
  Filled 2019-03-19: qty 1

## 2019-03-19 MED ORDER — PROPOFOL 500 MG/50ML IV EMUL
INTRAVENOUS | Status: DC | PRN
  Administered 2019-03-19: 50 ug/kg/min via INTRAVENOUS

## 2019-03-19 MED ORDER — ONDANSETRON HCL 4 MG/2ML IJ SOLN
INTRAMUSCULAR | Status: AC
  Filled 2019-03-19: qty 2

## 2019-03-19 MED ORDER — ALUM & MAG HYDROXIDE-SIMETH 200-200-20 MG/5ML PO SUSP: 30.0000 mL | Freq: Four times a day (QID) | ORAL | Status: DC | PRN

## 2019-03-19 MED ORDER — LIDOCAINE 2% (20 MG/ML) 5 ML SYRINGE
INTRAMUSCULAR | Status: AC
  Filled 2019-03-19: qty 5

## 2019-03-19 MED ORDER — ONDANSETRON HCL 4 MG/2ML IJ SOLN
INTRAMUSCULAR | Status: DC | PRN
  Administered 2019-03-19: 4 mg via INTRAVENOUS

## 2019-03-19 MED ORDER — HYDROMORPHONE HCL 1 MG/ML IJ SOLN
INTRAMUSCULAR | Status: AC
  Filled 2019-03-19: qty 0.5

## 2019-03-19 MED ORDER — CARVEDILOL 3.125 MG PO TABS
3.1250 mg | ORAL_TABLET | Freq: Every day | ORAL | Status: DC
  Administered 2019-03-20: 10:00:00 3.125 mg via ORAL
  Filled 2019-03-19: qty 1

## 2019-03-19 MED ORDER — FLEET ENEMA 7-19 GM/118ML RE ENEM: 1.0000 | ENEMA | Freq: Once | RECTAL | Status: DC | PRN

## 2019-03-19 MED ORDER — OXYCODONE HCL 5 MG PO TABS
5.0000 mg | ORAL_TABLET | ORAL | Status: DC | PRN
  Administered 2019-03-19 – 2019-03-20 (×4): 10 mg via ORAL
  Filled 2019-03-19 (×4): qty 2

## 2019-03-19 MED ORDER — ACETAMINOPHEN 10 MG/ML IV SOLN
INTRAVENOUS | Status: DC | PRN
  Administered 2019-03-19: 1000 mg via INTRAVENOUS

## 2019-03-19 MED ORDER — HYDROMORPHONE HCL 1 MG/ML IJ SOLN
1.0000 mg | INTRAMUSCULAR | Status: DC | PRN
  Administered 2019-03-19: 1 mg via INTRAVENOUS
  Filled 2019-03-19: qty 1

## 2019-03-19 MED ORDER — THROMBIN 5000 UNITS EX SOLR
OROMUCOSAL | Status: DC | PRN
  Administered 2019-03-19: 5 mL

## 2019-03-19 MED ORDER — CEFAZOLIN SODIUM-DEXTROSE 2-4 GM/100ML-% IV SOLN
2.0000 g | Freq: Three times a day (TID) | INTRAVENOUS | Status: AC
  Administered 2019-03-19 (×2): 2 g via INTRAVENOUS
  Filled 2019-03-19 (×2): qty 100

## 2019-03-19 MED ORDER — HYDROCODONE-ACETAMINOPHEN 5-325 MG PO TABS: 1.0000 | ORAL_TABLET | ORAL | Status: DC | PRN

## 2019-03-19 MED ORDER — PHENOL 1.4 % MT LIQD: 1.0000 | OROMUCOSAL | Status: DC | PRN

## 2019-03-19 MED ORDER — LACTATED RINGERS IV SOLN
INTRAVENOUS | Status: DC | PRN
  Administered 2019-03-19 (×2): via INTRAVENOUS

## 2019-03-19 MED ORDER — KETOROLAC TROMETHAMINE 30 MG/ML IJ SOLN
INTRAMUSCULAR | Status: AC
  Filled 2019-03-19: qty 1

## 2019-03-19 MED ORDER — ACETAMINOPHEN 160 MG/5ML PO SOLN: 1000.0000 mg | Freq: Once | ORAL | Status: DC | PRN

## 2019-03-19 MED ORDER — HYDROMORPHONE HCL 1 MG/ML IJ SOLN
INTRAMUSCULAR | Status: DC | PRN
  Administered 2019-03-19 (×2): .5 mg via INTRAVENOUS

## 2019-03-19 MED ORDER — SODIUM CHLORIDE 0.9 % IV SOLN: 250.0000 mL | INTRAVENOUS | Status: DC

## 2019-03-19 MED ORDER — 0.9 % SODIUM CHLORIDE (POUR BTL) OPTIME
TOPICAL | Status: DC | PRN
  Administered 2019-03-19: 1000 mL

## 2019-03-19 MED ORDER — SODIUM CHLORIDE 0.9 % IV SOLN
0.0500 ug/kg/min | INTRAVENOUS | Status: AC
  Administered 2019-03-19: .15 ug/kg/min via INTRAVENOUS
  Filled 2019-03-19: qty 4000

## 2019-03-19 MED ORDER — PROPOFOL 10 MG/ML IV BOLUS
INTRAVENOUS | Status: AC
  Filled 2019-03-19: qty 20

## 2019-03-19 MED ORDER — KETOROLAC TROMETHAMINE 30 MG/ML IJ SOLN
INTRAMUSCULAR | Status: DC | PRN
  Administered 2019-03-19: 30 mg via INTRAVENOUS

## 2019-03-19 MED ORDER — ACETAMINOPHEN 650 MG RE SUPP: 650.0000 mg | RECTAL | Status: DC | PRN

## 2019-03-19 MED ORDER — SUCCINYLCHOLINE CHLORIDE 200 MG/10ML IV SOSY
PREFILLED_SYRINGE | INTRAVENOUS | Status: DC | PRN
  Administered 2019-03-19: 100 mg via INTRAVENOUS

## 2019-03-19 MED ORDER — EPHEDRINE SULFATE-NACL 50-0.9 MG/10ML-% IV SOSY
PREFILLED_SYRINGE | INTRAVENOUS | Status: DC | PRN
  Administered 2019-03-19: 10 mg via INTRAVENOUS
  Administered 2019-03-19 (×2): 5 mg via INTRAVENOUS
  Administered 2019-03-19: 10 mg via INTRAVENOUS
  Administered 2019-03-19 (×3): 5 mg via INTRAVENOUS

## 2019-03-19 MED ORDER — KETOROLAC TROMETHAMINE 15 MG/ML IJ SOLN
15.0000 mg | Freq: Four times a day (QID) | INTRAMUSCULAR | Status: DC
  Administered 2019-03-19 – 2019-03-20 (×3): 15 mg via INTRAVENOUS
  Filled 2019-03-19 (×3): qty 1

## 2019-03-19 MED ORDER — SODIUM CHLORIDE 0.9% FLUSH: 3.0000 mL | INTRAVENOUS | Status: DC | PRN

## 2019-03-19 MED ORDER — ACETAMINOPHEN 10 MG/ML IV SOLN
INTRAVENOUS | Status: AC
  Filled 2019-03-19: qty 100

## 2019-03-19 MED ORDER — ATORVASTATIN CALCIUM 10 MG PO TABS
20.0000 mg | ORAL_TABLET | Freq: Every day | ORAL | Status: DC
  Administered 2019-03-19: 20 mg via ORAL
  Filled 2019-03-19: qty 2

## 2019-03-19 MED ORDER — ZOLPIDEM TARTRATE 5 MG PO TABS: 5.0000 mg | ORAL_TABLET | Freq: Every evening | ORAL | Status: DC | PRN

## 2019-03-19 MED ORDER — METHYLPREDNISOLONE ACETATE 80 MG/ML IJ SUSP
INTRAMUSCULAR | Status: AC
  Filled 2019-03-19: qty 1

## 2019-03-19 MED ORDER — FENTANYL CITRATE (PF) 100 MCG/2ML IJ SOLN
25.0000 ug | INTRAMUSCULAR | Status: DC | PRN
  Administered 2019-03-19 (×2): 50 ug via INTRAVENOUS

## 2019-03-19 MED ORDER — HYDROMORPHONE HCL 2 MG PO TABS: 2.0000 mg | ORAL_TABLET | ORAL | Status: DC | PRN

## 2019-03-19 MED ORDER — PROPOFOL 10 MG/ML IV BOLUS
INTRAVENOUS | Status: DC | PRN
  Administered 2019-03-19: 50 mg via INTRAVENOUS
  Administered 2019-03-19: 150 mg via INTRAVENOUS

## 2019-03-19 MED ORDER — BISACODYL 10 MG RE SUPP: 10.0000 mg | Freq: Every day | RECTAL | Status: DC | PRN

## 2019-03-19 MED ORDER — BUPIVACAINE HCL (PF) 0.5 % IJ SOLN
INTRAMUSCULAR | Status: DC | PRN
  Administered 2019-03-19: 20 mL

## 2019-03-19 MED ORDER — METHOCARBAMOL 750 MG PO TABS
750.0000 mg | ORAL_TABLET | Freq: Four times a day (QID) | ORAL | Status: DC | PRN
  Administered 2019-03-19 (×2): 750 mg via ORAL
  Filled 2019-03-19 (×2): qty 1

## 2019-03-19 MED ORDER — DULOXETINE HCL 30 MG PO CPEP
60.0000 mg | ORAL_CAPSULE | Freq: Every day | ORAL | Status: DC
  Administered 2019-03-20: 60 mg via ORAL
  Filled 2019-03-19: qty 2

## 2019-03-19 MED ORDER — LIDOCAINE-EPINEPHRINE 1 %-1:100000 IJ SOLN
INTRAMUSCULAR | Status: DC | PRN
  Administered 2019-03-19: 20 mL

## 2019-03-19 MED ORDER — POLYETHYLENE GLYCOL 3350 17 G PO PACK: 17.0000 g | PACK | Freq: Every day | ORAL | Status: DC | PRN

## 2019-03-19 MED ORDER — LISINOPRIL 20 MG PO TABS
20.0000 mg | ORAL_TABLET | Freq: Every day | ORAL | Status: DC
  Administered 2019-03-19 – 2019-03-20 (×2): 20 mg via ORAL
  Filled 2019-03-19 (×2): qty 1

## 2019-03-19 MED ORDER — ACETAMINOPHEN 10 MG/ML IV SOLN
1000.0000 mg | Freq: Once | INTRAVENOUS | Status: DC | PRN
  Administered 2019-03-19: 14:00:00 1000 mg via INTRAVENOUS

## 2019-03-19 MED ORDER — FENTANYL CITRATE (PF) 250 MCG/5ML IJ SOLN
INTRAMUSCULAR | Status: DC | PRN
  Administered 2019-03-19: 50 ug via INTRAVENOUS
  Administered 2019-03-19: 100 ug via INTRAVENOUS

## 2019-03-19 MED ORDER — FENTANYL CITRATE (PF) 250 MCG/5ML IJ SOLN
INTRAMUSCULAR | Status: AC
  Filled 2019-03-19: qty 5

## 2019-03-19 MED ORDER — GLIMEPIRIDE 2 MG PO TABS
2.0000 mg | ORAL_TABLET | Freq: Every day | ORAL | Status: DC
  Administered 2019-03-19: 22:00:00 2 mg via ORAL
  Filled 2019-03-19: qty 1

## 2019-03-19 MED ORDER — CHLORHEXIDINE GLUCONATE CLOTH 2 % EX PADS: 6.0000 | MEDICATED_PAD | Freq: Once | CUTANEOUS | Status: DC

## 2019-03-19 MED ORDER — ACETAMINOPHEN 500 MG PO TABS: 1000.0000 mg | ORAL_TABLET | Freq: Once | ORAL | Status: DC | PRN

## 2019-03-19 MED ORDER — HYDROCHLOROTHIAZIDE 12.5 MG PO CAPS
12.5000 mg | ORAL_CAPSULE | Freq: Every day | ORAL | Status: DC
  Administered 2019-03-19 – 2019-03-20 (×2): 12.5 mg via ORAL
  Filled 2019-03-19 (×2): qty 1

## 2019-03-19 MED ORDER — HYDROMORPHONE HCL 1 MG/ML IJ SOLN
INTRAMUSCULAR | Status: AC
  Filled 2019-03-19: qty 1

## 2019-03-19 MED ORDER — INSULIN ASPART 100 UNIT/ML ~~LOC~~ SOLN
0.0000 [IU] | Freq: Every day | SUBCUTANEOUS | Status: DC
  Administered 2019-03-19: 22:00:00 2 [IU] via SUBCUTANEOUS

## 2019-03-19 MED ORDER — OXYCODONE HCL 5 MG PO TABS
5.0000 mg | ORAL_TABLET | ORAL | Status: DC | PRN
  Administered 2019-03-19: 10 mg via ORAL
  Filled 2019-03-19: qty 2

## 2019-03-19 MED ORDER — LIDOCAINE-EPINEPHRINE 1 %-1:100000 IJ SOLN
INTRAMUSCULAR | Status: AC
  Filled 2019-03-19: qty 2

## 2019-03-19 MED ORDER — BUPIVACAINE HCL (PF) 0.5 % IJ SOLN
INTRAMUSCULAR | Status: AC
  Filled 2019-03-19: qty 60

## 2019-03-19 MED ORDER — PHENYLEPHRINE HCL-NACL 10-0.9 MG/250ML-% IV SOLN
INTRAVENOUS | Status: DC | PRN
  Administered 2019-03-19: 30 ug/min via INTRAVENOUS

## 2019-03-19 MED ORDER — LACTATED RINGERS IV SOLN
INTRAVENOUS | Status: DC | PRN
  Administered 2019-03-19: 06:00:00 via INTRAVENOUS

## 2019-03-19 MED ORDER — PHENYLEPHRINE 40 MCG/ML (10ML) SYRINGE FOR IV PUSH (FOR BLOOD PRESSURE SUPPORT)
PREFILLED_SYRINGE | INTRAVENOUS | Status: AC
  Filled 2019-03-19: qty 10

## 2019-03-19 MED ORDER — IPRATROPIUM BROMIDE 0.06 % NA SOLN: 2.0000 | Freq: Two times a day (BID) | NASAL | Status: DC

## 2019-03-19 MED ORDER — DIAZEPAM 5 MG PO TABS
5.0000 mg | ORAL_TABLET | Freq: Four times a day (QID) | ORAL | Status: DC | PRN
  Administered 2019-03-20 (×2): 5 mg via ORAL
  Filled 2019-03-19 (×2): qty 1

## 2019-03-19 MED ORDER — OXYCODONE HCL 5 MG PO TABS: 5.0000 mg | ORAL_TABLET | Freq: Once | ORAL | Status: DC | PRN

## 2019-03-19 MED ORDER — MENTHOL 3 MG MT LOZG
1.0000 | LOZENGE | OROMUCOSAL | Status: DC | PRN
  Administered 2019-03-19: 20:00:00 3 mg via ORAL
  Filled 2019-03-19: qty 9

## 2019-03-19 MED ORDER — DOCUSATE SODIUM 100 MG PO CAPS
100.0000 mg | ORAL_CAPSULE | Freq: Two times a day (BID) | ORAL | Status: DC
  Administered 2019-03-19 – 2019-03-20 (×2): 100 mg via ORAL
  Filled 2019-03-19 (×2): qty 1

## 2019-03-19 MED ORDER — DEXAMETHASONE SODIUM PHOSPHATE 10 MG/ML IJ SOLN
INTRAMUSCULAR | Status: DC | PRN
  Administered 2019-03-19: 5 mg via INTRAVENOUS

## 2019-03-19 MED ORDER — METFORMIN HCL 500 MG PO TABS
500.0000 mg | ORAL_TABLET | Freq: Two times a day (BID) | ORAL | Status: DC
  Administered 2019-03-19 – 2019-03-20 (×2): 500 mg via ORAL
  Filled 2019-03-19 (×2): qty 1

## 2019-03-19 MED ORDER — ONDANSETRON HCL 4 MG PO TABS: 4.0000 mg | ORAL_TABLET | Freq: Four times a day (QID) | ORAL | Status: DC | PRN

## 2019-03-19 MED ORDER — DEXAMETHASONE SODIUM PHOSPHATE 10 MG/ML IJ SOLN
INTRAMUSCULAR | Status: AC
  Filled 2019-03-19: qty 1

## 2019-03-19 MED ORDER — ACETAMINOPHEN 325 MG PO TABS: 650.0000 mg | ORAL_TABLET | ORAL | Status: DC | PRN

## 2019-03-19 MED ORDER — OXYCODONE HCL 5 MG/5ML PO SOLN: 5.0000 mg | Freq: Once | ORAL | Status: DC | PRN

## 2019-03-19 MED ORDER — SODIUM CHLORIDE 0.9% FLUSH
3.0000 mL | Freq: Two times a day (BID) | INTRAVENOUS | Status: DC
  Administered 2019-03-19 – 2019-03-20 (×2): 3 mL via INTRAVENOUS

## 2019-03-19 MED ORDER — PHENYLEPHRINE 40 MCG/ML (10ML) SYRINGE FOR IV PUSH (FOR BLOOD PRESSURE SUPPORT)
PREFILLED_SYRINGE | INTRAVENOUS | Status: DC | PRN
  Administered 2019-03-19: 80 ug via INTRAVENOUS
  Administered 2019-03-19: 120 ug via INTRAVENOUS

## 2019-03-19 MED ORDER — KCL IN DEXTROSE-NACL 20-5-0.45 MEQ/L-%-% IV SOLN: INTRAVENOUS | Status: DC

## 2019-03-19 MED ORDER — EPHEDRINE 5 MG/ML INJ
INTRAVENOUS | Status: AC
  Filled 2019-03-19: qty 10

## 2019-03-19 MED ORDER — THROMBIN 5000 UNITS EX SOLR
CUTANEOUS | Status: AC
  Filled 2019-03-19: qty 5000

## 2019-03-19 MED ORDER — EPHEDRINE SULFATE 50 MG/ML IJ SOLN: INTRAMUSCULAR | Status: DC | PRN

## 2019-03-19 MED ORDER — LIDOCAINE 2% (20 MG/ML) 5 ML SYRINGE
INTRAMUSCULAR | Status: DC | PRN
  Administered 2019-03-19: 100 mg via INTRAVENOUS

## 2019-03-19 MED ORDER — ONDANSETRON HCL 4 MG/2ML IJ SOLN: 4.0000 mg | Freq: Four times a day (QID) | INTRAMUSCULAR | Status: DC | PRN

## 2019-03-19 MED ORDER — PANTOPRAZOLE SODIUM 40 MG IV SOLR: 40.0000 mg | Freq: Every day | INTRAVENOUS | Status: DC

## 2019-03-19 MED ORDER — SUCCINYLCHOLINE CHLORIDE 200 MG/10ML IV SOSY
PREFILLED_SYRINGE | INTRAVENOUS | Status: AC
  Filled 2019-03-19: qty 10

## 2019-03-19 MED ORDER — HYDROMORPHONE HCL 1 MG/ML IJ SOLN
0.2500 mg | INTRAMUSCULAR | Status: DC | PRN
  Administered 2019-03-19 (×2): 0.5 mg via INTRAVENOUS

## 2019-03-19 MED ORDER — PROPOFOL 1000 MG/100ML IV EMUL
INTRAVENOUS | Status: AC
  Filled 2019-03-19: qty 100

## 2019-03-19 MED ORDER — HYDROXYZINE HCL 50 MG/ML IM SOLN: 50.0000 mg | Freq: Four times a day (QID) | INTRAMUSCULAR | Status: DC | PRN

## 2019-03-19 MED ORDER — BUPIVACAINE LIPOSOME 1.3 % IJ SUSP
20.0000 mL | INTRAMUSCULAR | Status: AC
  Administered 2019-03-19: 20 mL
  Filled 2019-03-19: qty 20

## 2019-03-19 MED ORDER — INSULIN ASPART 100 UNIT/ML ~~LOC~~ SOLN
0.0000 [IU] | Freq: Three times a day (TID) | SUBCUTANEOUS | Status: DC
  Administered 2019-03-19: 5 [IU] via SUBCUTANEOUS
  Administered 2019-03-20: 08:00:00 2 [IU] via SUBCUTANEOUS

## 2019-03-19 MED ORDER — LISINOPRIL-HYDROCHLOROTHIAZIDE 20-12.5 MG PO TABS: 1.0000 | ORAL_TABLET | Freq: Every day | ORAL | Status: DC

## 2019-03-19 MED ORDER — CEFAZOLIN SODIUM-DEXTROSE 2-4 GM/100ML-% IV SOLN
2.0000 g | INTRAVENOUS | Status: AC
  Administered 2019-03-19: 2 g via INTRAVENOUS
  Filled 2019-03-19: qty 100

## 2019-03-19 SURGICAL SUPPLY — 81 items
BENZOIN TINCTURE PRP APPL 2/3 (GAUZE/BANDAGES/DRESSINGS) ×8 IMPLANT
BLADE CLIPPER SURG (BLADE) IMPLANT
CARTRIDGE OIL MAESTRO DRILL (MISCELLANEOUS) ×2 IMPLANT
CLIP NEUROVISION LG (CLIP) ×4 IMPLANT
CLOSURE WOUND 1/2 X4 (GAUZE/BANDAGES/DRESSINGS) ×2
CONT SPEC 4OZ CLIKSEAL STRL BL (MISCELLANEOUS) ×4 IMPLANT
COVER BACK TABLE 24X17X13 BIG (DRAPES) IMPLANT
COVER BACK TABLE 60X90IN (DRAPES) ×4 IMPLANT
DECANTER SPIKE VIAL GLASS SM (MISCELLANEOUS) ×8 IMPLANT
DERMABOND ADVANCED (GAUZE/BANDAGES/DRESSINGS) ×6
DERMABOND ADVANCED .7 DNX12 (GAUZE/BANDAGES/DRESSINGS) ×6 IMPLANT
DIFFUSER DRILL AIR PNEUMATIC (MISCELLANEOUS) ×4 IMPLANT
DRAPE C-ARM 42X72 X-RAY (DRAPES) ×8 IMPLANT
DRAPE C-ARMOR (DRAPES) ×8 IMPLANT
DRAPE LAPAROTOMY 100X72X124 (DRAPES) ×8 IMPLANT
DRAPE POUCH INSTRU U-SHP 10X18 (DRAPES) ×4 IMPLANT
DRAPE SURG 17X23 STRL (DRAPES) ×4 IMPLANT
DRSG OPSITE POSTOP 3X4 (GAUZE/BANDAGES/DRESSINGS) ×4 IMPLANT
DRSG OPSITE POSTOP 4X6 (GAUZE/BANDAGES/DRESSINGS) ×4 IMPLANT
DRSG OPSITE POSTOP 4X8 (GAUZE/BANDAGES/DRESSINGS) ×8 IMPLANT
DURAPREP 26ML APPLICATOR (WOUND CARE) ×8 IMPLANT
ELECT REM PT RETURN 9FT ADLT (ELECTROSURGICAL) ×8
ELECTRODE REM PT RTRN 9FT ADLT (ELECTROSURGICAL) ×4 IMPLANT
GAUZE 4X4 16PLY RFD (DISPOSABLE) ×12 IMPLANT
GAUZE SPONGE 4X4 12PLY STRL (GAUZE/BANDAGES/DRESSINGS) ×4 IMPLANT
GLOVE BIO SURGEON STRL SZ8 (GLOVE) ×12 IMPLANT
GLOVE BIOGEL M 7.0 STRL (GLOVE) ×12 IMPLANT
GLOVE BIOGEL M STRL SZ7.5 (GLOVE) ×4 IMPLANT
GLOVE BIOGEL PI IND STRL 7.0 (GLOVE) ×12 IMPLANT
GLOVE BIOGEL PI IND STRL 7.5 (GLOVE) ×2 IMPLANT
GLOVE BIOGEL PI IND STRL 8 (GLOVE) ×4 IMPLANT
GLOVE BIOGEL PI IND STRL 8.5 (GLOVE) ×6 IMPLANT
GLOVE BIOGEL PI INDICATOR 7.0 (GLOVE) ×12
GLOVE BIOGEL PI INDICATOR 7.5 (GLOVE) ×2
GLOVE BIOGEL PI INDICATOR 8 (GLOVE) ×4
GLOVE BIOGEL PI INDICATOR 8.5 (GLOVE) ×6
GLOVE ECLIPSE 8.0 STRL XLNG CF (GLOVE) ×8 IMPLANT
GLOVE SS N UNI LF 6.5 STRL (GLOVE) ×16 IMPLANT
GOWN STRL REUS W/ TWL LRG LVL3 (GOWN DISPOSABLE) ×6 IMPLANT
GOWN STRL REUS W/ TWL XL LVL3 (GOWN DISPOSABLE) ×4 IMPLANT
GOWN STRL REUS W/TWL 2XL LVL3 (GOWN DISPOSABLE) ×8 IMPLANT
GOWN STRL REUS W/TWL LRG LVL3 (GOWN DISPOSABLE) ×6
GOWN STRL REUS W/TWL XL LVL3 (GOWN DISPOSABLE) ×4
GUIDEWIRE NITINOL BEVEL TIP (WIRE) ×32 IMPLANT
KIT BASIN OR (CUSTOM PROCEDURE TRAY) ×4 IMPLANT
KIT DILATOR XLIF 5 (KITS) ×3 IMPLANT
KIT INFUSE SMALL (Orthopedic Implant) ×4 IMPLANT
KIT POSITION SURG JACKSON T1 (MISCELLANEOUS) ×4 IMPLANT
KIT SURGICAL ACCESS MAXCESS 4 (KITS) ×4 IMPLANT
KIT TURNOVER KIT B (KITS) ×4 IMPLANT
KIT XLIF (KITS) ×1
MARKER SKIN DUAL TIP RULER LAB (MISCELLANEOUS) ×4 IMPLANT
MODULE NVM5 NEXT GEN EMG (NEEDLE) ×4 IMPLANT
MODULUS XLW 10X22X55MM 10 (Spine Construct) ×4 IMPLANT
MODULUS XLW 12X22X55MM 10 (Spine Construct) ×8 IMPLANT
NEEDLE HYPO 25X1 1.5 SAFETY (NEEDLE) ×4 IMPLANT
NEEDLE I PASS (NEEDLE) ×8 IMPLANT
NS IRRIG 1000ML POUR BTL (IV SOLUTION) ×8 IMPLANT
OIL CARTRIDGE MAESTRO DRILL (MISCELLANEOUS) ×4
PACK LAMINECTOMY NEURO (CUSTOM PROCEDURE TRAY) ×8 IMPLANT
PAD ARMBOARD 7.5X6 YLW CONV (MISCELLANEOUS) ×12 IMPLANT
PATTIES SURGICAL .5 X.5 (GAUZE/BANDAGES/DRESSINGS) IMPLANT
PATTIES SURGICAL .5 X1 (DISPOSABLE) IMPLANT
PATTIES SURGICAL 1X1 (DISPOSABLE) IMPLANT
PUTTY BONE ATTRAX 5CC STRIP (Putty) ×12 IMPLANT
ROD RELINE MAS LORD 5.5X100MM (Rod) ×4 IMPLANT
ROD RELINE MAS LORD 5.5X110MM (Rod) ×4 IMPLANT
SCREW LOCK RELINE 5.5 TULIP (Screw) ×32 IMPLANT
SCREW MAS RELINE 6.5X50 POLY (Screw) ×32 IMPLANT
SPONGE LAP 4X18 RFD (DISPOSABLE) IMPLANT
STAPLER SKIN PROX WIDE 3.9 (STAPLE) ×4 IMPLANT
STRIP CLOSURE SKIN 1/2X4 (GAUZE/BANDAGES/DRESSINGS) ×6 IMPLANT
SUT VIC AB 1 CT1 18XBRD ANBCTR (SUTURE) ×6 IMPLANT
SUT VIC AB 1 CT1 8-18 (SUTURE) ×6
SUT VIC AB 2-0 CT1 18 (SUTURE) ×16 IMPLANT
SUT VIC AB 3-0 SH 8-18 (SUTURE) ×16 IMPLANT
SYR TB 1ML 25GX5/8 (SYRINGE) IMPLANT
TOWEL GREEN STERILE (TOWEL DISPOSABLE) ×4 IMPLANT
TOWEL GREEN STERILE FF (TOWEL DISPOSABLE) ×4 IMPLANT
TRAY FOLEY MTR SLVR 16FR STAT (SET/KITS/TRAYS/PACK) ×4 IMPLANT
WATER STERILE IRR 1000ML POUR (IV SOLUTION) ×4 IMPLANT

## 2019-03-19 NOTE — Transfer of Care (Signed)
Immediate Anesthesia Transfer of Care Note  Patient: Marcus Carlson  Procedure(s) Performed: Right Lumbar One-Two Lumbar Two-Three Lumbar Three-Four Anterolateral lumbar interbody fusion with percutaneous pedicle screws, exploration and revision of adjacent level fusion (Right Spine Lumbar) LUMBAR PERCUTANEOUS PEDICLE SCREW THREE LEVEL (N/A Spine Lumbar)  Patient Location: PACU  Anesthesia Type:General  Level of Consciousness: awake, alert  and oriented  Airway & Oxygen Therapy: Patient Spontanous Breathing and Patient connected to nasal cannula oxygen  Post-op Assessment: Report given to RN and Post -op Vital signs reviewed and stable  Post vital signs: Reviewed and stable  Last Vitals:  Vitals Value Taken Time  BP 115/91 03/19/19 1326  Temp    Pulse 67 03/19/19 1327  Resp 18 03/19/19 1328  SpO2 97 % 03/19/19 1327  Vitals shown include unvalidated device data.  Last Pain:  Vitals:   03/19/19 1327  PainSc: (P) 6       Patients Stated Pain Goal: 3 (89/38/10 1751)  Complications: No apparent anesthesia complications

## 2019-03-19 NOTE — Brief Op Note (Signed)
03/19/2019  1:21 PM  PATIENT:  Marcus Carlson  73 y.o. male  PRE-OPERATIVE DIAGNOSIS:  Spinal stenosis of lumbar region with neurogenic claudication, lumbar spondylolisthesis, spondylosis, lumbago, radiculopathy L 12 , L 23, L 34 levels  POST-OPERATIVE DIAGNOSIS:  Spinal stenosis of lumbar region with neurogenic claudication, lumbar spondylolisthesis, spondylosis, lumbago, radiculopathy L 12 , L 23, L 34 levels   PROCEDURE:  Procedure(s) with comments: Right Lumbar One-Two Lumbar Two-Three Lumbar Three-Four Anterolateral lumbar interbody fusion with percutaneous pedicle screws, exploration and revision of adjacent level fusion (Right) - Right Lumbar One-Two Lumbar Two-Three Lumbar Three-Four Anterolateral lumbar interbody fusion with percutaneous pedicle screws, exploration and revision of adjacent level fusion LUMBAR PERCUTANEOUS PEDICLE SCREW THREE LEVEL (N/A) - LUMBAR PERCUTANEOUS PEDICLE SCREW THREE LEVEL  SURGEON:  Surgeon(s) and Role:    Maeola Harman, MD - Primary  PHYSICIAN ASSISTANT: Maurice Small, MD  ASSISTANTS: Potaet, RN   ANESTHESIA:   general  EBL:  200 mL   BLOOD ADMINISTERED:none  DRAINS: none   LOCAL MEDICATIONS USED:  MARCAINE    and LIDOCAINE   SPECIMEN:  No Specimen  DISPOSITION OF SPECIMEN:  N/A  COUNTS:  YES  TOURNIQUET:  * No tourniquets in log *  DICTATION: Patient is a 73 year old with severe spondylosis stenosis and spondylolisthesis of the lumbar spine. It was elected to take him to surgery for anterolateral decompression and posterior pedicle screw fixation.  The patient has a prior fusion at L 45.  Procedure: Patient was brought to the operating room and placed in a left lateral decubitus position on the operative table and using orthogonally projected C-arm fluoroscopy the patient was placed so that the L 12,  L 23,  and L 34 levels were visualized in AP and lateral plane. The patient was then taped into position. The table was flexed so as to  expose the L 34 level. Skin was marked along with a posterior finger dissection incision. His flank was then prepped and draped in usual sterile fashion and incisions were made sequentially at  L 34 and L 23 levels. Posterior finger dissection was made to enter the retroperitoneal space and then subsequently the probe was inserted into the psoas muscle from the right side initially at the L 34 level. After mapping the neural elements were able to dock the probe per the midpoint of this vertebral level and without indications electrically of too close proximity to the neural tissues. Subsequently the self-retaining tractor was.after sequential dilators were utilized the shim was employed and the interspace was cleared of psoas muscle and then incised. A thorough discectomy was performed. Instruments were used to clear the interspace of disc material. An anterior entry with posterior trajectory was performed to avoid neural elements.   After thorough discectomy was performed and this was performed using AP and lateral fluoroscopy a 12 lordotic by 55 x 22 mm implant was packed with small BMP and Attrax. This was tamped into position and its position was confirmed on AP and lateral fluoroscopy. Subsequently exposure was performed at the L 23 level and similar dissection was performed with locking of the self-retaining retractor. At this level were able to place an 12 lordotic by 22 x 55 mm implant packed in a similar fashion. At the L 12 level were able to place an 10 mm lordotic by 55 x 22 mm implant packed in a similar fashion. Hemostasis was assured the wounds were irrigated and closed with interrupted Vicryl sutures.  Sterile occlusive dressings were placed.  Retractor times were:  L 34: 27 minutes;  L 23: 17 minutes; L 12: 18 minutes.   Patient was then turned into a prone position on the [place] table and using AP and lateral fluoroscopy throughout this portion of the procedure, pedicle screws were placed  using Reline Nuvasive cannulated percutaneous screws.  First, the midline incision was reopened and old hardware at L 4 and L 5 was removed.  There was a solid fusion with  Bridging bone at this level.  Subsequently, using a more lateral trajectory and entry, percutaneous screws were placed from L 1 - L 4 level.  2 screws were placed at L 1 and L 2 and (6.5 x 50 mm) and 2 at L3 (6.5 x 50), and two at L4 of a similar size,  2 at L5  (6.5 x 50). 110 mm rod was then affixed to the screw heads do a separate stab incision and locked down on the screws on the left and 100 mm rod on the right. All connections were then torqued and the Towers were disassembled. The wounds were irrigated and then closed with 1, 2-0 and 3-0 Vicryl stitches. Sterile occlusive dressing was placed with Dermabond. Long-acting Marcaine was injected. The patient was then extubated in the operating room and taken to recovery in stable and satisfactory condition having tolerated her operation well. Counts were correct at the end of the case.  Pelvic Parameters:  Preop: LL: 36 degrees  PLAN OF CARE: Admit to inpatient   PATIENT DISPOSITION:  PACU - hemodynamically stable.   Delay start of Pharmacological VTE agent (>24hrs) due to surgical blood loss or risk of bleeding: yes

## 2019-03-19 NOTE — Anesthesia Postprocedure Evaluation (Signed)
Anesthesia Post Note  Patient: Marcus Carlson  Procedure(s) Performed: Right Lumbar One-Two Lumbar Two-Three Lumbar Three-Four Anterolateral lumbar interbody fusion with percutaneous pedicle screws, exploration and revision of adjacent level fusion (Right Spine Lumbar) LUMBAR PERCUTANEOUS PEDICLE SCREW THREE LEVEL (N/A Spine Lumbar)     Patient location during evaluation: PACU Anesthesia Type: General Level of consciousness: awake and alert Pain management: pain level controlled Vital Signs Assessment: post-procedure vital signs reviewed and stable Respiratory status: spontaneous breathing, nonlabored ventilation and respiratory function stable Cardiovascular status: blood pressure returned to baseline and stable Postop Assessment: no apparent nausea or vomiting Anesthetic complications: no    Last Vitals:  Vitals:   03/19/19 1456 03/19/19 1457  BP: 140/80 140/80  Pulse: 83 79  Resp: 20 20  Temp:  36.6 C  SpO2: 92% 92%    Last Pain:  Vitals:   03/19/19 1604  TempSrc:   PainSc: 10-Worst pain ever                 Lidia Collum

## 2019-03-19 NOTE — Anesthesia Procedure Notes (Signed)
Procedure Name: Intubation Date/Time: 03/19/2019 7:50 AM Performed by: Trinna Post., CRNA Pre-anesthesia Checklist: Patient identified, Emergency Drugs available, Suction available, Patient being monitored and Timeout performed Patient Re-evaluated:Patient Re-evaluated prior to induction Oxygen Delivery Method: Circle system utilized Preoxygenation: Pre-oxygenation with 100% oxygen Induction Type: IV induction, Rapid sequence and Cricoid Pressure applied Laryngoscope Size: Mac and 4 Grade View: Grade I Tube type: Oral Tube size: 8.0 mm Number of attempts: 1 Airway Equipment and Method: Stylet Placement Confirmation: ETT inserted through vocal cords under direct vision,  positive ETCO2 and breath sounds checked- equal and bilateral Secured at: 23 cm Tube secured with: Tape Dental Injury: Teeth and Oropharynx as per pre-operative assessment

## 2019-03-19 NOTE — Op Note (Signed)
03/19/2019  1:21 PM  PATIENT:  Marcus Carlson  73 y.o. male  PRE-OPERATIVE DIAGNOSIS:  Spinal stenosis of lumbar region with neurogenic claudication, lumbar spondylolisthesis, spondylosis, lumbago, radiculopathy L 12 , L 23, L 34 levels  POST-OPERATIVE DIAGNOSIS:  Spinal stenosis of lumbar region with neurogenic claudication, lumbar spondylolisthesis, spondylosis, lumbago, radiculopathy L 12 , L 23, L 34 levels   PROCEDURE:  Procedure(s) with comments: Right Lumbar One-Two Lumbar Two-Three Lumbar Three-Four Anterolateral lumbar interbody fusion with percutaneous pedicle screws, exploration and revision of adjacent level fusion (Right) - Right Lumbar One-Two Lumbar Two-Three Lumbar Three-Four Anterolateral lumbar interbody fusion with percutaneous pedicle screws, exploration and revision of adjacent level fusion LUMBAR PERCUTANEOUS PEDICLE SCREW THREE LEVEL (N/A) - LUMBAR PERCUTANEOUS PEDICLE SCREW THREE LEVEL  SURGEON:  Surgeon(s) and Role:    Maeola Harman, MD - Primary  PHYSICIAN ASSISTANT: Maurice Small, MD  ASSISTANTS: Potaet, RN   ANESTHESIA:   general  EBL:  200 mL   BLOOD ADMINISTERED:none  DRAINS: none   LOCAL MEDICATIONS USED:  MARCAINE    and LIDOCAINE   SPECIMEN:  No Specimen  DISPOSITION OF SPECIMEN:  N/A  COUNTS:  YES  TOURNIQUET:  * No tourniquets in log *  DICTATION: Patient is a 73 year old with severe spondylosis stenosis and spondylolisthesis of the lumbar spine. It was elected to take him to surgery for anterolateral decompression and posterior pedicle screw fixation.  The patient has a prior fusion at L 45.  Procedure: Patient was brought to the operating room and placed in a left lateral decubitus position on the operative table and using orthogonally projected C-arm fluoroscopy the patient was placed so that the L 12,  L 23,  and L 34 levels were visualized in AP and lateral plane. The patient was then taped into position. The table was flexed so as to  expose the L 34 level. Skin was marked along with a posterior finger dissection incision. His flank was then prepped and draped in usual sterile fashion and incisions were made sequentially at  L 34 and L 23 levels. Posterior finger dissection was made to enter the retroperitoneal space and then subsequently the probe was inserted into the psoas muscle from the right side initially at the L 34 level. After mapping the neural elements were able to dock the probe per the midpoint of this vertebral level and without indications electrically of too close proximity to the neural tissues. Subsequently the self-retaining tractor was.after sequential dilators were utilized the shim was employed and the interspace was cleared of psoas muscle and then incised. A thorough discectomy was performed. Instruments were used to clear the interspace of disc material. An anterior entry with posterior trajectory was performed to avoid neural elements.   After thorough discectomy was performed and this was performed using AP and lateral fluoroscopy a 12 lordotic by 55 x 22 mm implant was packed with small BMP and Attrax. This was tamped into position and its position was confirmed on AP and lateral fluoroscopy. Subsequently exposure was performed at the L 23 level and similar dissection was performed with locking of the self-retaining retractor. At this level were able to place an 12 lordotic by 22 x 55 mm implant packed in a similar fashion. At the L 12 level were able to place an 10 mm lordotic by 55 x 22 mm implant packed in a similar fashion. Hemostasis was assured the wounds were irrigated and closed with interrupted Vicryl sutures.  Sterile occlusive dressings were placed.  Retractor times were:  L 34: 27 minutes;  L 23: 17 minutes; L 12: 18 minutes.   Patient was then turned into a prone position on the [place] table and using AP and lateral fluoroscopy throughout this portion of the procedure, pedicle screws were placed  using Reline Nuvasive cannulated percutaneous screws.  First, the midline incision was reopened and old hardware at L 4 and L 5 was removed.  There was a solid fusion with  Bridging bone at this level.  Subsequently, using a more lateral trajectory and entry, percutaneous screws were placed from L 1 - L 4 level.  2 screws were placed at L 1 and L 2 and (6.5 x 50 mm) and 2 at L3 (6.5 x 50), and two at L4 of a similar size,  2 at L5  (6.5 x 50). 110 mm rod was then affixed to the screw heads do a separate stab incision and locked down on the screws on the left and 100 mm rod on the right. All connections were then torqued and the Towers were disassembled. The wounds were irrigated and then closed with 1, 2-0 and 3-0 Vicryl stitches. Sterile occlusive dressing was placed with Dermabond. Long-acting Marcaine was injected. The patient was then extubated in the operating room and taken to recovery in stable and satisfactory condition having tolerated her operation well. Counts were correct at the end of the case.  Pelvic Parameters:  Preop: LL: 36 degrees  PLAN OF CARE: Admit to inpatient   PATIENT DISPOSITION:  PACU - hemodynamically stable.   Delay start of Pharmacological VTE agent (>24hrs) due to surgical blood loss or risk of bleeding: yes

## 2019-03-19 NOTE — Interval H&P Note (Signed)
History and Physical Interval Note:  03/19/2019 7:33 AM  Marcus Carlson  has presented today for surgery, with the diagnosis of Spinal stenosis of lumbar region with neurogenic claudication.  The various methods of treatment have been discussed with the patient and family. After consideration of risks, benefits and other options for treatment, the patient has consented to  Procedure(s) with comments: Right Lumbar One-Two Lumbar Two-Three Lumbar Three-Four Anterolateral lumbar interbody fusion with percutaneous pedicle screws, exploration and revision of adjacent level fusion (Right) - Right Lumbar One-Two Lumbar Two-Three Lumbar Three-Four Anterolateral lumbar interbody fusion with percutaneous pedicle screws, exploration and revision of adjacent level fusion [place] (N/A) - LUMBAR PERCUTANEOUS PEDICLE SCREW THREE LEVEL as a surgical intervention.  The patient's history has been reviewed, patient examined, no change in status, stable for surgery.  I have reviewed the patient's chart and labs.  Questions were answered to the patient's satisfaction.     Peggyann Shoals

## 2019-03-19 NOTE — Progress Notes (Signed)
Awake, alert, conversant.  Full bilateral lower extremity strength.  Patient is doing well.

## 2019-03-20 LAB — GLUCOSE, CAPILLARY: Glucose-Capillary: 126 mg/dL — ABNORMAL HIGH (ref 70–99)

## 2019-03-20 MED ORDER — METHOCARBAMOL 750 MG PO TABS: 750.0000 mg | ORAL_TABLET | Freq: Four times a day (QID) | ORAL | 0 refills | Status: DC | PRN

## 2019-03-20 MED ORDER — OXYCODONE HCL 5 MG PO TABS: 5.0000 mg | ORAL_TABLET | ORAL | 0 refills | Status: DC | PRN

## 2019-03-20 NOTE — Discharge Instructions (Signed)

## 2019-03-20 NOTE — Progress Notes (Signed)
Patient is discharged from room 3C03 at this time. Alert and in stable condition. IV sites d/c'd and instructions read to patient with understanding verbalized. Left unit via wheelchair with all belongings at side.

## 2019-03-20 NOTE — Evaluation (Signed)
Occupational Therapy Evaluation and Discharge Patient Details Name: Marcus Carlson MRN: [id number] DOB: 1945-06-22 Today's Date: 03/20/2019    History of Present Illness This 73 y.o. male admitted for L1-2, L2-3, L3-4 anterolateral lumbar fusion.  PMH includes:  DM, HTN, CAD, s/p L4-5 fusion   Clinical Impression   Pt with hx of back surgery and familiar with precautions. Reinforced back precautions during ADL and use of AE for LB bathing and dressing. Pt can defer heavy IADL to others. No further OT needs.    Follow Up Recommendations  No OT follow up    Equipment Recommendations  None recommended by OT    Recommendations for Other Services       Precautions / Restrictions Precautions Precautions: Back Precaution Booklet Issued: Yes (comment) Precaution Comments: Reviewed precautions, provided handout Required Braces or Orthoses: Spinal Brace Spinal Brace: Applied in sitting position Restrictions Weight Bearing Restrictions: No      Mobility Bed Mobility               General bed mobility comments: Sitting EOB upon OT arrival, reinforced log roll technique.  Transfers Overall transfer level: Modified independent Equipment used: None Transfers: Sit to/from Stand Sit to Stand: Min guard         General transfer comment: Min guard for safety. Stood from EOB x1, slow to rise.    Balance Overall balance assessment: Needs assistance Sitting-balance support: Feet supported;No upper extremity supported Sitting balance-Leahy Scale: Good Sitting balance - Comments: Able to donn brace wihtout difficulty.   Standing balance support: During functional activity Standing balance-Leahy Scale: Fair Standing balance comment: Able to stand statically for ~5 minutes talking to Md. Sway noted but no LOB.                           ADL either performed or assessed with clinical judgement   ADL Overall ADL's : Modified independent                                        General ADL Comments: Pt is knowledgeable in use of AE for LB ADL. Educated in Economist during IADL and activities to avoid. Instructed in two cup method for toothbrushing, avoiding twisting with pericare and to purchase tongs should he experience difficulty. Pt plans to sit to shower and has all necessary DME.     Vision Patient Visual Report: No change from baseline       Perception     Praxis      Pertinent Vitals/Pain Pain Assessment: Faces Pain Score: 9  Faces Pain Scale: Hurts a little bit Pain Location: right side of back Pain Descriptors / Indicators: Sore;Operative site guarding;Aching Pain Intervention(s): Monitored during session;Repositioned;Premedicated before session     Hand Dominance Right   Extremity/Trunk Assessment Upper Extremity Assessment Upper Extremity Assessment: Overall WFL for tasks assessed   Lower Extremity Assessment Lower Extremity Assessment: Defer to PT evaluation LLE Deficits / Details: Limited ankle DF (premorbid)   Cervical / Trunk Assessment Cervical / Trunk Assessment: Other exceptions Cervical / Trunk Exceptions: s/p back surgery   Communication Communication Communication: HOH   Cognition Arousal/Alertness: Awake/alert Behavior During Therapy: WFL for tasks assessed/performed Overall Cognitive Status: Within Functional Limits for tasks assessed  General Comments  Incisions, clean dry and intact, no drainage.    Exercises     Shoulder Instructions      Home Living Family/patient expects to be discharged to:: Private residence Living Arrangements: Spouse/significant other Available Help at Discharge: Family;Available 24 hours/day Type of Home: House Home Access: Ramped entrance     Home Layout: One level     Bathroom Shower/Tub: Producer, television/film/video: Standard Bathroom Accessibility: Yes   Home Equipment: Environmental consultant - 2  wheels;Hospital bed;Bedside commode;Cane - single point;Shower seat - built Designer, fashion/clothing: Reacher;Sock aid;Long-handled shoe horn;Long-handled sponge        Prior Functioning/Environment Level of Independence: Independent        Comments: Cares for wife and does all IADLs, drives. Has a cleaning lady.        OT Problem List:        OT Treatment/Interventions:      OT Goals(Current goals can be found in the care plan section) Acute Rehab OT Goals Patient Stated Goal: to go home today  OT Frequency:     Barriers to D/C:            Co-evaluation              AM-PAC OT "6 Clicks" Daily Activity     Outcome Measure Help from another person eating meals?: None Help from another person taking care of personal grooming?: None Help from another person toileting, which includes using toliet, bedpan, or urinal?: None Help from another person bathing (including washing, rinsing, drying)?: None Help from another person to put on and taking off regular upper body clothing?: None Help from another person to put on and taking off regular lower body clothing?: None 6 Click Score: 24   End of Session Equipment Utilized During Treatment: Back brace  Activity Tolerance: Patient tolerated treatment well Patient left: in bed;with call bell/phone within reach  OT Visit Diagnosis: Pain                Time: 2505-3976 OT Time Calculation (min): 19 min Charges:  OT General Charges $OT Visit: 1 Visit OT Evaluation $OT Eval Low Complexity: 1 Low  Martie Round, OTR/L Acute Rehabilitation Services Pager: [phone] Office: [phone]  Evern Bio 03/20/2019, 9:51 AM

## 2019-03-20 NOTE — Evaluation (Signed)
Physical Therapy Evaluation Patient Details Name: Marcus Carlson MRN: [id number] DOB: Jan 11, 1946 Today's Date: 03/20/2019   History of Present Illness  This 72 y.o. male admitted for L1-2, L2-3, L3-4 anterolateral lumbar fusion.  PMH includes:  DM, HTN, CAD, s/p L4-5 fusion  Clinical Impression  Patient presents with pain and post surgical deficits s/p above surgery. Pt independent PTA and cares for wife. Today, pt tolerated transfers and gait training with supervision-Min A for safety. Reports he will use a SPC if he needs it. Education re: back precautions, positioning, log roll technique, walking program etc. Will follow acutely to maximize independence and mobility prior to return home.    Follow Up Recommendations No PT follow up;Supervision - Intermittent    Equipment Recommendations  None recommended by PT    Recommendations for Other Services       Precautions / Restrictions Precautions Precautions: Back Precaution Booklet Issued: Yes (comment) Precaution Comments: Reviewed precautions, provided handout Required Braces or Orthoses: Spinal Brace Spinal Brace: Applied in sitting position Restrictions Weight Bearing Restrictions: No      Mobility  Bed Mobility               General bed mobility comments: Sitting EOB upon PT arrival.  Transfers Overall transfer level: Needs assistance Equipment used: None Transfers: Sit to/from Stand Sit to Stand: Min guard         General transfer comment: Min guard for safety. Stood from EOB x1, slow to rise.  Ambulation/Gait Ambulation/Gait assistance: Supervision Gait Distance (Feet): 350 Feet Assistive device: None Gait Pattern/deviations: Step-through pattern;Decreased stride length;Wide base of support;Trunk flexed   Gait velocity interpretation: <1.8 ft/sec, indicate of risk for recurrent falls General Gait Details: Slow, mostly steady gait with wide BoS and increased knee flexion bilaterally. needs cues for  upright posture.  Stairs            Wheelchair Mobility    Modified Rankin (Stroke Patients Only)       Balance Overall balance assessment: Needs assistance Sitting-balance support: Feet supported;No upper extremity supported Sitting balance-Leahy Scale: Good Sitting balance - Comments: Able to donn brace wihtout difficulty.   Standing balance support: During functional activity Standing balance-Leahy Scale: Fair Standing balance comment: Able to stand statically for ~5 minutes talking to Md. Sway noted but no LOB.                             Pertinent Vitals/Pain Pain Assessment: 0-10 Pain Score: 9  Pain Location: right side of back Pain Descriptors / Indicators: Sore;Operative site guarding;Aching Pain Intervention(s): Monitored during session;Repositioned    Home Living Family/patient expects to be discharged to:: Private residence Living Arrangements: Spouse/significant other Available Help at Discharge: Family;Available 24 hours/day Type of Home: House Home Access: Ramped entrance     Home Layout: One level Home Equipment: Walker - 2 wheels;Hospital bed;Bedside commode;Cane - single point;Shower seat - built in      Prior Function Level of Independence: Independent         Comments: Cares for wife and does all IADLs, drives. Has a cleaning lady.     Hand Dominance        Extremity/Trunk Assessment   Upper Extremity Assessment Upper Extremity Assessment: Defer to OT evaluation    Lower Extremity Assessment Lower Extremity Assessment: LLE deficits/detail LLE Deficits / Details: Limited ankle DF (premorbid)    Cervical / Trunk Assessment Cervical / Trunk Assessment: Other exceptions Cervical /  Trunk Exceptions: s/p back surgery  Communication   Communication: HOH  Cognition Arousal/Alertness: Awake/alert Behavior During Therapy: WFL for tasks assessed/performed Overall Cognitive Status: Within Functional Limits for tasks  assessed                                        General Comments General comments (skin integrity, edema, etc.): Incisions, clean dry and intact, no drainage.    Exercises     Assessment/Plan    PT Assessment Patient needs continued PT services  PT Problem List Decreased mobility;Pain;Decreased balance;Decreased skin integrity       PT Treatment Interventions Therapeutic activities;Gait training;Therapeutic exercise;Patient/family education;Balance training;Functional mobility training    PT Goals (Current goals can be found in the Care Plan section)  Acute Rehab PT Goals Patient Stated Goal: to go home today PT Goal Formulation: With patient Time For Goal Achievement: 04/03/19 Potential to Achieve Goals: Good    Frequency Min 5X/week   Barriers to discharge Decreased caregiver support cares for wife, has 30 y/o grandchild    Co-evaluation               AM-PAC PT "6 Clicks" Mobility  Outcome Measure Help needed turning from your back to your side while in a flat bed without using bedrails?: None Help needed moving from lying on your back to sitting on the side of a flat bed without using bedrails?: A Little Help needed moving to and from a bed to a chair (including a wheelchair)?: A Little Help needed standing up from a chair using your arms (e.g., wheelchair or bedside chair)?: A Little Help needed to walk in hospital room?: None Help needed climbing 3-5 steps with a railing? : A Little 6 Click Score: 20    End of Session Equipment Utilized During Treatment: Gait belt;Back brace Activity Tolerance: Patient tolerated treatment well Patient left: in bed;with call bell/phone within reach Nurse Communication: Mobility status PT Visit Diagnosis: Pain;Unsteadiness on feet (R26.81);Difficulty in walking, not elsewhere classified (R26.2) Pain - part of body: (back)    Time: 9326-7124 PT Time Calculation (min) (ACUTE ONLY): 28 min   Charges:   PT  Evaluation $PT Eval Moderate Complexity: 1 Mod PT Treatments $Gait Training: 8-22 mins        Marcus Carlson, PT, DPT Acute Rehabilitation Services Pager [phone] Office [phone]      Marcus Carlson A Marcus Carlson 03/20/2019, 9:27 AM

## 2019-03-21 NOTE — Discharge Summary (Addendum)
Physician Discharge Summary  Patient ID: Marcus Carlson MRN: [id number] DOB/AGE: [birth year] 73 y.o.  Admit date: 03/19/2019 Discharge date: 03/20/2019  Admission Diagnoses:  Spinal stenosis of lumbar region with neurogenic claudication, lumbar spondylolisthesis, spondylosis, lumbago, radiculopathy L 12 , L 23, L 34 levels  Discharge Diagnoses:  Spinal stenosis of lumbar region with neurogenic claudication, lumbar spondylolisthesis, spondylosis, lumbago, radiculopathy L 12 , L 23, L 34 levels Active Problems:   Spondylolisthesis of lumbar region   Discharged Condition: Good  Hospital Course: Patient admitted by Dr. Vertell Limber who performed a lumbar fusion.  Postoperatively the patient has been up and ambulating.  His dressings are C/D/I.  He is asking to be D/C'd to home.  Instructions re: wound care and activity have been given.  He has an appointment to followup with Dr. Vertell Limber.  Discharge Exam: Blood pressure 111/67, pulse (!) 108, temperature 98.8 F (37.1 C), temperature source Oral, resp. rate 19, height 6\' 3"  (1.905 m), weight 99.8 kg, SpO2 91 %.  Disposition:  Home  Discharge Instructions    Discharge wound care:   Complete by: As directed    Leave the wound open to air. Shower daily with the wound uncovered. Water and soapy water should run over the incision area. Do not wash directly on the incision for 2 weeks. Remove the glue after 2 weeks.   Driving Restrictions   Complete by: As directed    No driving for 2 weeks. May ride in the car locally now. May begin to drive locally in 2 weeks.   Other Restrictions   Complete by: As directed    Walk gradually increasing distances out in the fresh air at least twice a day. Walking additional 6 times inside the house, gradually increasing distances, daily. No bending, lifting, or twisting. Perform activities between shoulder and waist height (that is at counter height when standing or table height when sitting).     Allergies as of  03/20/2019      Reactions   Ivp Dye [iodinated Diagnostic Agents] Nausea And Vomiting      Medication List    STOP taking these medications   cephALEXin 500 MG capsule Commonly known as: KEFLEX     TAKE these medications   atorvastatin 40 MG tablet Commonly known as: LIPITOR Take 20 mg by mouth at bedtime.   carvedilol 3.125 MG tablet Commonly known as: COREG Take 3.125 mg by mouth daily.   clopidogrel 75 MG tablet Commonly known as: PLAVIX Take 75 mg by mouth daily.   desonide 0.05 % lotion Commonly known as: DESOWEN Apply 1 application topically 2 (two) times daily as needed (rash on face).   DULoxetine 60 MG capsule Commonly known as: CYMBALTA Take 60 mg by mouth daily.   glimepiride 2 MG tablet Commonly known as: AMARYL Take 2 mg by mouth at bedtime.   ipratropium 0.06 % nasal spray Commonly known as: ATROVENT Place 2 sprays into both nostrils 2 (two) times daily.   lisinopril-hydrochlorothiazide 20-12.5 MG tablet Commonly known as: ZESTORETIC Take 1 tablet by mouth daily.   metFORMIN 500 MG tablet Commonly known as: GLUCOPHAGE Take 500 mg by mouth 2 (two) times daily with a meal.   methocarbamol 750 MG tablet Commonly known as: ROBAXIN Take 1 tablet (750 mg total) by mouth every 6 (six) hours as needed for muscle spasms.   oxyCODONE 5 MG immediate release tablet Commonly known as: Oxy IR/ROXICODONE Take 1-2 tablets (5-10 mg total) by mouth every 3 (three) hours as  needed (pain).   pantoprazole 40 MG tablet Commonly known as: PROTONIX Take 40 mg by mouth daily.            Discharge Care Instructions  (From admission, onward)         Start     Ordered   03/20/19 0000  Discharge wound care:    Comments: Leave the wound open to air. Shower daily with the wound uncovered. Water and soapy water should run over the incision area. Do not wash directly on the incision for 2 weeks. Remove the glue after 2 weeks.   03/20/19 [id number]            Signed: Hewitt Shorts, MD 03/21/2019, 8:52 PM

## 2019-03-22 ENCOUNTER — Encounter: Payer: Self-pay | Admitting: *Deleted

## 2019-03-25 ENCOUNTER — Other Ambulatory Visit: Payer: Self-pay

## 2019-03-25 ENCOUNTER — Emergency Department (HOSPITAL_COMMUNITY): Payer: Medicare Other

## 2019-03-25 ENCOUNTER — Encounter (HOSPITAL_COMMUNITY): Payer: Self-pay | Admitting: Emergency Medicine

## 2019-03-25 ENCOUNTER — Inpatient Hospital Stay (HOSPITAL_COMMUNITY)
Admission: EM | Admit: 2019-03-25 | Discharge: 2019-03-30 | DRG: 389 | Disposition: A | Discharge status: Home / Self Care | Payer: Medicare Other | Attending: Internal Medicine | Admitting: Internal Medicine

## 2019-03-25 DIAGNOSIS — Z87442 Personal history of urinary calculi: Secondary | ICD-10-CM | POA: Diagnosis not present

## 2019-03-25 DIAGNOSIS — Z9089 Acquired absence of other organs: Secondary | ICD-10-CM | POA: Diagnosis not present

## 2019-03-25 DIAGNOSIS — Z87891 Personal history of nicotine dependence: Secondary | ICD-10-CM

## 2019-03-25 DIAGNOSIS — Z7902 Long term (current) use of antithrombotics/antiplatelets: Secondary | ICD-10-CM

## 2019-03-25 DIAGNOSIS — K9189 Other postprocedural complications and disorders of digestive system: Secondary | ICD-10-CM | POA: Diagnosis present

## 2019-03-25 DIAGNOSIS — I10 Essential (primary) hypertension: Secondary | ICD-10-CM | POA: Diagnosis present

## 2019-03-25 DIAGNOSIS — E876 Hypokalemia: Secondary | ICD-10-CM | POA: Diagnosis not present

## 2019-03-25 DIAGNOSIS — E872 Acidosis, unspecified: Secondary | ICD-10-CM

## 2019-03-25 DIAGNOSIS — R109 Unspecified abdominal pain: Secondary | ICD-10-CM

## 2019-03-25 DIAGNOSIS — E119 Type 2 diabetes mellitus without complications: Secondary | ICD-10-CM | POA: Diagnosis present

## 2019-03-25 DIAGNOSIS — K219 Gastro-esophageal reflux disease without esophagitis: Secondary | ICD-10-CM | POA: Diagnosis present

## 2019-03-25 DIAGNOSIS — N179 Acute kidney failure, unspecified: Secondary | ICD-10-CM

## 2019-03-25 DIAGNOSIS — Z7984 Long term (current) use of oral hypoglycemic drugs: Secondary | ICD-10-CM | POA: Diagnosis not present

## 2019-03-25 DIAGNOSIS — K560 Paralytic ileus: Secondary | ICD-10-CM | POA: Diagnosis not present

## 2019-03-25 DIAGNOSIS — I2510 Atherosclerotic heart disease of native coronary artery without angina pectoris: Secondary | ICD-10-CM | POA: Diagnosis present

## 2019-03-25 DIAGNOSIS — Z955 Presence of coronary angioplasty implant and graft: Secondary | ICD-10-CM | POA: Diagnosis not present

## 2019-03-25 DIAGNOSIS — Z91041 Radiographic dye allergy status: Secondary | ICD-10-CM | POA: Diagnosis not present

## 2019-03-25 DIAGNOSIS — M1990 Unspecified osteoarthritis, unspecified site: Secondary | ICD-10-CM | POA: Diagnosis present

## 2019-03-25 DIAGNOSIS — Z20828 Contact with and (suspected) exposure to other viral communicable diseases: Secondary | ICD-10-CM | POA: Diagnosis present

## 2019-03-25 DIAGNOSIS — Z6828 Body mass index (BMI) 28.0-28.9, adult: Secondary | ICD-10-CM

## 2019-03-25 DIAGNOSIS — E669 Obesity, unspecified: Secondary | ICD-10-CM | POA: Diagnosis present

## 2019-03-25 DIAGNOSIS — Z79899 Other long term (current) drug therapy: Secondary | ICD-10-CM

## 2019-03-25 DIAGNOSIS — E785 Hyperlipidemia, unspecified: Secondary | ICD-10-CM | POA: Diagnosis present

## 2019-03-25 DIAGNOSIS — K567 Ileus, unspecified: Secondary | ICD-10-CM

## 2019-03-25 DIAGNOSIS — Z981 Arthrodesis status: Secondary | ICD-10-CM

## 2019-03-25 DIAGNOSIS — K5900 Constipation, unspecified: Secondary | ICD-10-CM

## 2019-03-25 LAB — COMPREHENSIVE METABOLIC PANEL
ALT: 18 U/L (ref 0–44)
AST: 25 U/L (ref 15–41)
Albumin: 3 g/dL — ABNORMAL LOW (ref 3.5–5.0)
Alkaline Phosphatase: 74 U/L (ref 38–126)
Anion gap: 24 — ABNORMAL HIGH (ref 5–15)
BUN: 25 mg/dL — ABNORMAL HIGH (ref 8–23)
CO2: 17 mmol/L — ABNORMAL LOW (ref 22–32)
Calcium: 8.5 mg/dL — ABNORMAL LOW (ref 8.9–10.3)
Chloride: 92 mmol/L — ABNORMAL LOW (ref 98–111)
Creatinine, Ser: 1.72 mg/dL — ABNORMAL HIGH (ref 0.61–1.24)
GFR calc Af Amer: 44 mL/min — ABNORMAL LOW (ref >60)
GFR calc non Af Amer: 38 mL/min — ABNORMAL LOW (ref >60)
Glucose, Bld: 169 mg/dL — ABNORMAL HIGH (ref 70–99)
Potassium: 3.1 mmol/L — ABNORMAL LOW (ref 3.5–5.1)
Sodium: 133 mmol/L — ABNORMAL LOW (ref 135–145)
Total Bilirubin: 1.1 mg/dL (ref 0.3–1.2)
Total Protein: 6.1 g/dL — ABNORMAL LOW (ref 6.5–8.1)

## 2019-03-25 LAB — CBC
HCT: 34.3 % — ABNORMAL LOW (ref 39.0–52.0)
Hemoglobin: 11.1 g/dL — ABNORMAL LOW (ref 13.0–17.0)
MCH: 29.4 pg (ref 26.0–34.0)
MCHC: 32.4 g/dL (ref 30.0–36.0)
MCV: 90.7 fL (ref 80.0–100.0)
Platelets: 421 10*3/uL — ABNORMAL HIGH (ref 150–400)
RBC: 3.78 MIL/uL — ABNORMAL LOW (ref 4.22–5.81)
RDW: 13.5 % (ref 11.5–15.5)
WBC: 9.8 10*3/uL (ref 4.0–10.5)
nRBC: 0 % (ref 0.0–0.2)

## 2019-03-25 LAB — URINALYSIS, ROUTINE W REFLEX MICROSCOPIC
Bilirubin Urine: NEGATIVE
Glucose, UA: NEGATIVE mg/dL
Hgb urine dipstick: NEGATIVE
Ketones, ur: NEGATIVE mg/dL
Leukocytes,Ua: NEGATIVE
Nitrite: NEGATIVE
Protein, ur: NEGATIVE mg/dL
Specific Gravity, Urine: 1.018 (ref 1.005–1.030)
pH: 5 (ref 5.0–8.0)

## 2019-03-25 LAB — LACTIC ACID, PLASMA
Lactic Acid, Venous: 1.1 mmol/L (ref 0.5–1.9)
Lactic Acid, Venous: 1.1 mmol/L (ref 0.5–1.9)

## 2019-03-25 LAB — LIPASE, BLOOD: Lipase: 15 U/L (ref 11–51)

## 2019-03-25 MED ORDER — SODIUM CHLORIDE 0.9% FLUSH
3.0000 mL | Freq: Once | INTRAVENOUS | Status: AC
  Administered 2019-03-25: 3 mL via INTRAVENOUS

## 2019-03-25 MED ORDER — SODIUM CHLORIDE 0.9 % IV BOLUS
1000.0000 mL | Freq: Once | INTRAVENOUS | Status: AC
  Administered 2019-03-25: 19:00:00 1000 mL via INTRAVENOUS

## 2019-03-25 MED ORDER — FENTANYL CITRATE (PF) 100 MCG/2ML IJ SOLN
50.0000 ug | Freq: Once | INTRAMUSCULAR | Status: AC
  Administered 2019-03-25: 50 ug via INTRAVENOUS
  Filled 2019-03-25: qty 2

## 2019-03-25 MED ORDER — POTASSIUM CHLORIDE 10 MEQ/100ML IV SOLN
10.0000 meq | INTRAVENOUS | Status: AC
  Administered 2019-03-25 (×2): 10 meq via INTRAVENOUS
  Filled 2019-03-25 (×2): qty 100

## 2019-03-25 MED ORDER — SODIUM CHLORIDE 0.9 % IV BOLUS
500.0000 mL | Freq: Once | INTRAVENOUS | Status: AC
  Administered 2019-03-25: 500 mL via INTRAVENOUS

## 2019-03-25 MED FILL — Heparin Sodium (Porcine) Inj 1000 Unit/ML: INTRAMUSCULAR | Qty: 30 | Status: AC

## 2019-03-25 MED FILL — Sodium Chloride IV Soln 0.9%: INTRAVENOUS | Qty: 1000 | Status: AC

## 2019-03-25 NOTE — ED Triage Notes (Signed)
Pt to ER for evaluation of persistent back pain after surgical intervention performed by neurosurgery, he was d/c last Saturday. Reports the pain medication he is on is not helping him and he is concerned about the abdominal pain he has had since Saturday. Reports has a difficult time having a bowel movement with softener use. Reports nausea as well. He is a.o x4.

## 2019-03-25 NOTE — ED Provider Notes (Signed)
[facility] EMERGENCY DEPARTMENT Provider Note   CSN: [id number] Arrival date & time: 03/25/19  1251     History Chief Complaint  Patient presents with  . Post-op Problem    Marcus Carlson is a 73 y.o. male.  Patient here with worsening abdominal pain, bloating, constipation as well as back pain.  He underwent lumbar fusion on December 11 by Dr. Vertell Limber.  Has been taking oxycodone at home for pain.  He went home on December 12.  He states his abdominal pain started that day is progressively worsened.  The abdominal pain is diffuse and crampy associated with constipation.  He is still passing gas.  Is been taking Senokot at home for constipation and believes he had a bowel movement this morning that was a "blowout".  In his last bowel it was 2 days prior to this.  He feels constipated and bloated with worsening pain.  Has had nausea but no vomiting.  No fever.  Denies any new weakness, numbness or tingling.  Has some chronic numbness and weakness in his leg which is unchanged on the left.  Denies any bowel or bladder incontinence.  States he has the sensation of needing to go on the toilet but sometimes is not able to.  He is not lost control of his bowels or bladder.  Denies any chest pain or shortness of breath.  He did call his surgeon but did not get a reply back  The history is provided by the patient.       Past Medical History:  Diagnosis Date  . Arthritis   . Coronary artery disease    stent placed 2006  . Diabetes mellitus without complication ([place])   . GERD (gastroesophageal reflux disease)   . History of hiatal hernia   . History of kidney stones   . Hypertension   . Kidney stones   . Neuromuscular disorder ([place])   . Type 2 diabetes mellitus [facility])     Patient Active Problem List   Diagnosis Date Noted  . Spondylolisthesis of lumbar region 03/10/2015    Past Surgical History:  Procedure Laterality Date  . ANTERIOR LAT LUMBAR FUSION Right  03/19/2019   Procedure: Right Lumbar One-Two Lumbar Two-Three Lumbar Three-Four Anterolateral lumbar interbody fusion with percutaneous pedicle screws, exploration and revision of adjacent level fusion;  Surgeon: Erline Levine, MD;  Location: [facility];  Service: Neurosurgery;  Laterality: Right;  Right Lumbar One-Two Lumbar Two-Three Lumbar Three-Four Anterolateral lumbar interbody fusion with percutaneous pedicle  . CORONARY ANGIOPLASTY    . LUMBAR PERCUTANEOUS PEDICLE SCREW 3 LEVEL N/A 03/19/2019   Procedure: LUMBAR PERCUTANEOUS PEDICLE SCREW THREE LEVEL;  Surgeon: Erline Levine, MD;  Location: [facility];  Service: Neurosurgery;  Laterality: N/A;  LUMBAR PERCUTANEOUS PEDICLE SCREW THREE LEVEL  . MASTOIDECTOMY     mastoid cyst removed  . MAXIMUM ACCESS (MAS)POSTERIOR LUMBAR INTERBODY FUSION (PLIF) 1 LEVEL N/A 03/10/2015   Procedure: Lumbar four-five Maximum access posterior lumbar interbody fusion;  Surgeon: Erline Levine, MD;  Location: [place] [facility];  Service: Neurosurgery;  Laterality: N/A;  Lumbar four-five Maximum access posterior lumbar interbody fusion  . righ arm surgery    . TONSILLECTOMY         History reviewed. No pertinent family history.  Social History   Tobacco Use  . Smoking status: Former Research scientist (life sciences)  . Smokeless tobacco: Never Used  Substance Use Topics  . Alcohol use: No  . Drug use: No    Home Medications Prior  to Admission medications   Medication Sig Start Date End Date Taking? Authorizing Provider  atorvastatin (LIPITOR) 40 MG tablet Take 20 mg by mouth at bedtime.     [provider]  carvedilol (COREG) 3.125 MG tablet Take 3.125 mg by mouth daily.     [provider]  clopidogrel (PLAVIX) 75 MG tablet Take 75 mg by mouth daily.    [provider]  desonide (DESOWEN) 0.05 % lotion Apply 1 application topically 2 (two) times daily as needed (rash on face).     [provider]  DULoxetine (CYMBALTA) 60 MG capsule Take 60 mg by mouth  daily.    [provider]  glimepiride (AMARYL) 2 MG tablet Take 2 mg by mouth at bedtime.     [provider]  ipratropium (ATROVENT) 0.06 % nasal spray Place 2 sprays into both nostrils 2 (two) times daily.    [provider]  lisinopril-hydrochlorothiazide (PRINZIDE,ZESTORETIC) 20-12.5 MG tablet Take 1 tablet by mouth daily.    [provider]  metFORMIN (GLUCOPHAGE) 500 MG tablet Take 500 mg by mouth 2 (two) times daily with a meal.     [provider]  methocarbamol (ROBAXIN) 750 MG tablet Take 1 tablet (750 mg total) by mouth every 6 (six) hours as needed for muscle spasms. 03/20/19   Shirlean KellyNudelman, Robert, MD  oxyCODONE (OXY IR/ROXICODONE) 5 MG immediate release tablet Take 1-2 tablets (5-10 mg total) by mouth every 3 (three) hours as needed (pain). 03/20/19   Shirlean KellyNudelman, Robert, MD  pantoprazole (PROTONIX) 40 MG tablet Take 40 mg by mouth daily.    [provider]    Allergies    Ivp dye [iodinated diagnostic agents]  Review of Systems   Review of Systems  Constitutional: Positive for activity change and appetite change. Negative for fever.  HENT: Negative for congestion and rhinorrhea.   Eyes: Negative for visual disturbance.  Respiratory: Negative for cough, chest tightness and shortness of breath.   Cardiovascular: Negative for chest pain.  Gastrointestinal: Positive for abdominal pain and nausea. Negative for vomiting.  Genitourinary: Negative for dysuria and hematuria.  Musculoskeletal: Positive for back pain. Negative for arthralgias and myalgias.  Skin: Negative for rash.  Neurological: Positive for weakness. Negative for dizziness, light-headedness and headaches.   all other systems are negative except as noted in the HPI and PMH.    Physical Exam Updated Vital Signs BP (!) 106/51   Pulse 93   Temp 98.1 F (36.7 C) (Oral)   Resp 18   SpO2 95%   Physical Exam Vitals and nursing note reviewed.  Constitutional:       General: He is not in acute distress.    Appearance: He is well-developed. He is obese.  HENT:     Head: Normocephalic and atraumatic.     Mouth/Throat:     Pharynx: No oropharyngeal exudate.  Eyes:     Conjunctiva/sclera: Conjunctivae normal.     Pupils: Pupils are equal, round, and reactive to light.  Neck:     Comments: No meningismus. Cardiovascular:     Rate and Rhythm: Normal rate and regular rhythm.     Heart sounds: Normal heart sounds. No murmur.  Pulmonary:     Effort: Pulmonary effort is normal. No respiratory distress.     Breath sounds: Normal breath sounds.  Abdominal:     Palpations: Abdomen is soft.     Tenderness: There is abdominal tenderness. There is no guarding or rebound.     Comments:  Distended abdomen, diffuse cramping.  No guarding or rebound.  There is bilateral flank ecchymosis  Genitourinary:    Comments: No fecal impaction. No gross blood Musculoskeletal:        General: No tenderness. Normal range of motion.     Cervical back: Normal range of motion and neck supple.     Comments: Lumbar incisions are healing appropriately without surrounding erythema or drainage.  5/5 strength in bilateral lower extremities. Ankle plantar and dorsiflexion intact. Great toe extension intact bilaterally. +2 DP and PT pulses. +2 patellar reflexes bilaterally. Normal gait.   Skin:    General: Skin is warm.     Capillary Refill: Capillary refill takes less than 2 seconds.  Neurological:     Mental Status: He is alert and oriented to person, place, and time.     Cranial Nerves: No cranial nerve deficit.     Motor: No abnormal muscle tone.     Coordination: Coordination normal.     Comments: No ataxia on finger to nose bilaterally. No pronator drift. 5/5 strength throughout. CN 2-12 intact.Equal grip strength. Sensation intact.   Psychiatric:        Behavior: Behavior normal.     ED Results / Procedures / Treatments   Labs (all labs ordered are listed, but only  abnormal results are displayed) Labs Reviewed  COMPREHENSIVE METABOLIC PANEL - Abnormal; Notable for the following components:      Result Value   Sodium 133 (*)    Potassium 3.1 (*)    Chloride 92 (*)    CO2 17 (*)    Glucose, Bld 169 (*)    BUN 25 (*)    Creatinine, Ser 1.72 (*)    Calcium 8.5 (*)    Total Protein 6.1 (*)    Albumin 3.0 (*)    GFR calc non Af Amer 38 (*)    GFR calc Af Amer 44 (*)    Anion gap 24 (*)    All other components within normal limits  CBC - Abnormal; Notable for the following components:   RBC 3.78 (*)    Hemoglobin 11.1 (*)    HCT 34.3 (*)    Platelets 421 (*)    All other components within normal limits  URINE CULTURE  SARS CORONAVIRUS 2 (TAT 6-24 HRS)  LIPASE, BLOOD  URINALYSIS, ROUTINE W REFLEX MICROSCOPIC  LACTIC ACID, PLASMA  LACTIC ACID, PLASMA    EKG None  Radiology CT ABDOMEN PELVIS WO CONTRAST  Result Date: 03/25/2019 CLINICAL DATA:  Acute abdominal pain. Back surgery on 03/19/2019. Persistent back pain. EXAM: CT ABDOMEN AND PELVIS WITHOUT CONTRAST TECHNIQUE: Multidetector CT imaging of the abdomen and pelvis was performed following the standard protocol without IV contrast. COMPARISON:  None. FINDINGS: Lower chest: No acute findings. There is subcutaneous air along right posterolateral lower chest wall. Hepatobiliary: Liver normal in size. There are several subcentimeter low-density liver lesions not fully characterized on this unenhanced study, but likely cysts. No other liver masses or lesions. Gallbladder is distended. No stones or wall thickening. No bile duct dilation. Pancreas: Unremarkable. No pancreatic ductal dilatation or surrounding inflammatory changes. Spleen: Normal in size without focal abnormality. Adrenals/Urinary Tract: No adrenal masses. Kidneys normal in size, orientation and position. Low-density mass arises from the mid to upper pole the right kidney, 2.2 cm, consistent a cyst. No other renal masses, no stones and  no hydronephrosis. Normal ureters. Normal bladder. Stomach/Bowel: Colon is mildly distended, maximum diameter of 7.6 cm. There are colonic air-fluid  levels. No colonic wall thickening or adjacent inflammation. No evidence of obstruction. Small bowel is normal in caliber. No small bowel wall thickening inflammation. Small hiatal hernia. Stomach otherwise unremarkable. No evidence of appendicitis. Vascular/Lymphatic: Aortic atherosclerosis. No aneurysm. No enlarged lymph nodes. Reproductive: Enlarged prostate, 5.7 x 4.5 x 4.7 cm. Other: Soft tissue air extends the right psoas muscle, into the superior right retroperitoneum adjacent to the posteromedial right hemidiaphragm and right adrenal gland. Soft tissue air also is seen posterolaterally in the pelvis, posterior to the right colon and extending adjacent to the iliacus muscle. Stranding is seen in the fat adjacent to the right psoas muscle in its upper extent. Small amount subcutaneous air along right posterolateral abdominal wall extending to the chest. No formed fluid collection is seen indicate an abscess. No air is visualized within the spinal canal. No air is seen along posterior paraspinal soft tissues. No free intraperitoneal air.  No ascites. Musculoskeletal: Well-positioned pedicle screws are noted from L1 through L4. There are metal intervertebral cage is well centered at the L1-L2, L2-L3 and L3-L4 levels. Intervertebral bone graft material noted at L4-L5 and L5-S1. No fracture. There is no bone resorption to suggest osteomyelitis. No evidence of loosening of the orthopedic hardware. In the posterior paraspinal soft tissues is no visualized fluid collection to suggest an abscess. IMPRESSION: 1. There is soft tissue air extends along mid to upper right psoas muscle, into the superior right retroperitoneum, in the extraperitoneal soft tissues of the right pelvis adjacent to the iliacus muscle, and along the posterolateral abdominal wall extending to the  chest. The source of this area is not defined. There is some inflammation adjacent to the superior right psoas muscle. No convincing abscess. 2. Colon is mildly distended with air-fluid levels. This is consistent a mild colonic adynamic ileus. 3. No other acute abnormality within the abdomen or pelvis. Electronically Signed   By: Amie Portland M.D.   On: 03/25/2019 18:39    Procedures Procedures (including critical care time)  Medications Ordered in ED Medications  sodium chloride flush (NS) 0.9 % injection 3 mL (has no administration in time range)  sodium chloride 0.9 % bolus 500 mL (has no administration in time range)    ED Course  I have reviewed the triage vital signs and the nursing notes.  Pertinent labs & imaging results that were available during my care of the patient were reviewed by me and considered in my medical decision making (see chart for details).    MDM Rules/Calculators/A&P                      Patient here with postoperative abdominal pain constipation and bloating.  No fever or vomiting.  Patient given hydration.  Labs show mild AKI and hypokalemia with anion gap acidosis.  Patient has no new weakness, numbness, tingling.  No fever or vomiting.  No bowel or bladder incontinence.  Low suspicion for cord compression or cauda equina. Postvoid residual 77 mL.  CT scan shows no bowel obstruction but does show large colonic ileus.  There is also extensive soft tissue air about the soft tissues of the abdomen and psoas muscle.  Discussed with radiology Dr. Oleta Mouse is not certain the origin of this.  There is no abscess or other evidence to suggest infection.  May suggest that psoas muscle was violated during surgery.  Results discussed with Dr. Lovell Sheehan covering for Dr. Venetia Maxon.  He agrees that the air on the CT scan is an  expected postoperative finding with a retroperitoneal approach.  He does not see any concerning abscess.  He does does not feel the patient needs any  acute surgical intervention.  Patient does have AKI with lactic acidosis and hypokalemia with a colonic ileus.  Dr. Lovell Sheehan agrees with medical admission for observation and will consult in the morning.  Patient will be hydrated overnight, correction of his hyperkalemia, lactic acidosis and colonic ileus.  He is agreeable to admission.  Discussed with Dr. Toniann Fail. Final Clinical Impression(s) / ED Diagnoses Final diagnoses:  Postoperative ileus (HCC)  AKI (acute kidney injury) (HCC)  Metabolic acidosis    Rx / DC Orders ED Discharge Orders    None       Celestial Barnfield, Jeannett Senior, MD 03/25/19 2139

## 2019-03-26 ENCOUNTER — Encounter (HOSPITAL_COMMUNITY): Payer: Self-pay | Admitting: Internal Medicine

## 2019-03-26 ENCOUNTER — Inpatient Hospital Stay (HOSPITAL_COMMUNITY): Payer: Medicare Other

## 2019-03-26 DIAGNOSIS — K560 Paralytic ileus: Principal | ICD-10-CM

## 2019-03-26 DIAGNOSIS — E876 Hypokalemia: Secondary | ICD-10-CM

## 2019-03-26 DIAGNOSIS — N179 Acute kidney failure, unspecified: Secondary | ICD-10-CM

## 2019-03-26 LAB — BASIC METABOLIC PANEL
Anion gap: 13 (ref 5–15)
BUN: 22 mg/dL (ref 8–23)
CO2: 28 mmol/L (ref 22–32)
Calcium: 8.4 mg/dL — ABNORMAL LOW (ref 8.9–10.3)
Chloride: 97 mmol/L — ABNORMAL LOW (ref 98–111)
Creatinine, Ser: 1.37 mg/dL — ABNORMAL HIGH (ref 0.61–1.24)
GFR calc Af Amer: 58 mL/min — ABNORMAL LOW (ref >60)
GFR calc non Af Amer: 50 mL/min — ABNORMAL LOW (ref >60)
Glucose, Bld: 139 mg/dL — ABNORMAL HIGH (ref 70–99)
Potassium: 3.2 mmol/L — ABNORMAL LOW (ref 3.5–5.1)
Sodium: 138 mmol/L (ref 135–145)

## 2019-03-26 LAB — CBC
HCT: 34.5 % — ABNORMAL LOW (ref 39.0–52.0)
HCT: 33.4 % — ABNORMAL LOW (ref 39.0–52.0)
Hemoglobin: 11.1 g/dL — ABNORMAL LOW (ref 13.0–17.0)
Hemoglobin: 10.6 g/dL — ABNORMAL LOW (ref 13.0–17.0)
MCH: 29.2 pg (ref 26.0–34.0)
MCH: 29.1 pg (ref 26.0–34.0)
MCHC: 32.2 g/dL (ref 30.0–36.0)
MCHC: 31.7 g/dL (ref 30.0–36.0)
MCV: 90.8 fL (ref 80.0–100.0)
MCV: 91.8 fL (ref 80.0–100.0)
Platelets: 411 10*3/uL — ABNORMAL HIGH (ref 150–400)
Platelets: 410 10*3/uL — ABNORMAL HIGH (ref 150–400)
RBC: 3.8 MIL/uL — ABNORMAL LOW (ref 4.22–5.81)
RBC: 3.64 MIL/uL — ABNORMAL LOW (ref 4.22–5.81)
RDW: 13.7 % (ref 11.5–15.5)
RDW: 13.6 % (ref 11.5–15.5)
WBC: 9.5 10*3/uL (ref 4.0–10.5)
WBC: 7.9 10*3/uL (ref 4.0–10.5)
nRBC: 0 % (ref 0.0–0.2)
nRBC: 0 % (ref 0.0–0.2)

## 2019-03-26 LAB — URINE CULTURE: Culture: NO GROWTH

## 2019-03-26 LAB — CBG MONITORING, ED
Glucose-Capillary: 116 mg/dL — ABNORMAL HIGH (ref 70–99)
Glucose-Capillary: 125 mg/dL — ABNORMAL HIGH (ref 70–99)
Glucose-Capillary: 131 mg/dL — ABNORMAL HIGH (ref 70–99)
Glucose-Capillary: 140 mg/dL — ABNORMAL HIGH (ref 70–99)

## 2019-03-26 LAB — CREATININE, SERUM
Creatinine, Ser: 1.47 mg/dL — ABNORMAL HIGH (ref 0.61–1.24)
GFR calc Af Amer: 54 mL/min — ABNORMAL LOW (ref >60)
GFR calc non Af Amer: 46 mL/min — ABNORMAL LOW (ref >60)

## 2019-03-26 LAB — SARS CORONAVIRUS 2 (TAT 6-24 HRS): SARS Coronavirus 2: NEGATIVE

## 2019-03-26 LAB — GLUCOSE, CAPILLARY: Glucose-Capillary: 115 mg/dL — ABNORMAL HIGH (ref 70–99)

## 2019-03-26 LAB — MAGNESIUM: Magnesium: 1.8 mg/dL (ref 1.7–2.4)

## 2019-03-26 LAB — TSH: TSH: 0.704 u[IU]/mL (ref 0.350–4.500)

## 2019-03-26 MED ORDER — ONDANSETRON HCL 4 MG PO TABS: 4.0000 mg | ORAL_TABLET | Freq: Four times a day (QID) | ORAL | Status: DC | PRN

## 2019-03-26 MED ORDER — INSULIN ASPART 100 UNIT/ML ~~LOC~~ SOLN
0.0000 [IU] | SUBCUTANEOUS | Status: DC
  Administered 2019-03-26 – 2019-03-27 (×4): 1 [IU] via SUBCUTANEOUS

## 2019-03-26 MED ORDER — DULOXETINE HCL 60 MG PO CPEP
60.0000 mg | ORAL_CAPSULE | Freq: Every day | ORAL | Status: DC
  Administered 2019-03-26 – 2019-03-30 (×5): 60 mg via ORAL
  Filled 2019-03-26 (×5): qty 1

## 2019-03-26 MED ORDER — CLOPIDOGREL BISULFATE 75 MG PO TABS
75.0000 mg | ORAL_TABLET | Freq: Every day | ORAL | Status: DC
  Administered 2019-03-26 – 2019-03-30 (×5): 75 mg via ORAL
  Filled 2019-03-26 (×5): qty 1

## 2019-03-26 MED ORDER — METHOCARBAMOL 500 MG PO TABS
750.0000 mg | ORAL_TABLET | Freq: Four times a day (QID) | ORAL | Status: DC | PRN
  Administered 2019-03-26 – 2019-03-30 (×5): 750 mg via ORAL
  Filled 2019-03-26 (×5): qty 2

## 2019-03-26 MED ORDER — POTASSIUM CHLORIDE 2 MEQ/ML IV SOLN: INTRAVENOUS | Status: DC

## 2019-03-26 MED ORDER — ACETAMINOPHEN 325 MG PO TABS: 650.0000 mg | ORAL_TABLET | Freq: Four times a day (QID) | ORAL | Status: DC | PRN

## 2019-03-26 MED ORDER — ACETAMINOPHEN 650 MG RE SUPP: 650.0000 mg | Freq: Four times a day (QID) | RECTAL | Status: DC | PRN

## 2019-03-26 MED ORDER — POTASSIUM CHLORIDE 10 MEQ/100ML IV SOLN: 10.0000 meq | INTRAVENOUS | Status: DC

## 2019-03-26 MED ORDER — ONDANSETRON HCL 4 MG/2ML IJ SOLN: 4.0000 mg | Freq: Four times a day (QID) | INTRAMUSCULAR | Status: DC | PRN

## 2019-03-26 MED ORDER — CARVEDILOL 3.125 MG PO TABS
3.1250 mg | ORAL_TABLET | Freq: Every day | ORAL | Status: DC
  Administered 2019-03-26 – 2019-03-30 (×5): 3.125 mg via ORAL
  Filled 2019-03-26 (×5): qty 1

## 2019-03-26 MED ORDER — HEPARIN SODIUM (PORCINE) 5000 UNIT/ML IJ SOLN
5000.0000 [IU] | Freq: Three times a day (TID) | INTRAMUSCULAR | Status: DC
  Administered 2019-03-26 – 2019-03-30 (×12): 5000 [IU] via SUBCUTANEOUS
  Filled 2019-03-26 (×12): qty 1

## 2019-03-26 MED ORDER — LABETALOL HCL 5 MG/ML IV SOLN: 10.0000 mg | INTRAVENOUS | Status: DC | PRN

## 2019-03-26 MED ORDER — MORPHINE SULFATE (PF) 2 MG/ML IV SOLN
1.0000 mg | Freq: Once | INTRAVENOUS | Status: AC
  Administered 2019-03-26: 01:00:00 1 mg via INTRAVENOUS
  Filled 2019-03-26: qty 1

## 2019-03-26 MED ORDER — MORPHINE SULFATE (PF) 2 MG/ML IV SOLN
1.0000 mg | Freq: Once | INTRAVENOUS | Status: AC
  Administered 2019-03-26: 1 mg via INTRAVENOUS
  Filled 2019-03-26: qty 1

## 2019-03-26 MED ORDER — POTASSIUM CHLORIDE 10 MEQ/100ML IV SOLN
10.0000 meq | INTRAVENOUS | Status: AC
  Administered 2019-03-26 (×2): 10 meq via INTRAVENOUS
  Filled 2019-03-26 (×2): qty 100

## 2019-03-26 MED ORDER — ATORVASTATIN CALCIUM 10 MG PO TABS
20.0000 mg | ORAL_TABLET | Freq: Every day | ORAL | Status: DC
  Administered 2019-03-26 – 2019-03-29 (×5): 20 mg via ORAL
  Filled 2019-03-26 (×5): qty 2

## 2019-03-26 MED ORDER — POTASSIUM CHLORIDE 2 MEQ/ML IV SOLN
INTRAVENOUS | Status: AC
  Filled 2019-03-26 (×2): qty 1000

## 2019-03-26 MED ORDER — TRAMADOL HCL 50 MG PO TABS
50.0000 mg | ORAL_TABLET | Freq: Four times a day (QID) | ORAL | Status: DC | PRN
  Administered 2019-03-26 – 2019-03-28 (×3): 50 mg via ORAL
  Filled 2019-03-26 (×3): qty 1

## 2019-03-26 MED ORDER — MAGNESIUM SULFATE 4 GM/100ML IV SOLN
4.0000 g | Freq: Once | INTRAVENOUS | Status: AC
  Administered 2019-03-26: 4 g via INTRAVENOUS
  Filled 2019-03-26 (×2): qty 100

## 2019-03-26 NOTE — Progress Notes (Signed)
Received report from [facility] in ED at 1626.   Skin assessment: Bruise left lower back (somewhat medial), bruise at sacrum, left lower abdomen bruise, scattered bruise bilateral arms, 6 surgical scars with surgical glue on lower back, honeycomb on right lower back.   Paged Rodena Piety at [phone] abd pain radiate to back not treated by tramadol. Alternative? Can pt. have ice chips and/or [place] ice? Upon callback informed by MD to only offer a few ice chips and encouraged ambulation. Provided patient with a few ice chips, encouraged ambulation and informed of need to minimize use of stronger pain med and oral intake to avoid exacerbating ileus. Patient ok with taking Tramadol to see if pain better managed and will ambulate as suggested

## 2019-03-26 NOTE — H&P (Signed)
History and Physical    Marcus GrippeJames R Detty ZOX:[id number] DOB: [birth year] DOA: 03/25/2019  PCP: Isabella BowensVasireddy, Venugopal Kiran, MD  Patient coming from: Home.  Chief Complaint: Abdominal pain.  HPI: Marcus Carlson is a 73 y.o. male with history of diabetes mellitus type 2, hypertension, CAD presents to the ER 6 days after lumbar fusion surgery with complaints of abdominal pain.  Patient states that he has been having difficulty moving his bowel and generalized abdominal pain with no vomiting is able to take his regular food.  Last bowel movement was 2 days ago and had some today after Senokot.  Pain is crampy in nature diffuse abdominal.  Also has baseline low back pain.  Denies any weakness of extremities.  Denies chest pain or shortness of breath.  ED Course: In the ER patient's labs show acute renal failure with creatinine increasing from 1.3-1.7 hypokalemia of potassium 3.1 hemoglobin of 11.1 and Covid test was negative.  Given the abdominal pain patient had a CT abdomen pelvis which shows features concerning for adynamic ileus and also there is some air seen in the right psoas muscle extending to the soft tissues.  ER physician discussed with Dr. Lovell SheehanJenkins on-call for Dr. Venetia MaxonStern neurosurgeon.  Per neurosurgeon these findings are consistent from recent surgery.  They will be following in consult.  Patient was started on IV fluids potassium replacement admitted for adynamic ileus.  Review of Systems: As per HPI, rest all negative.   Past Medical History:  Diagnosis Date  . Arthritis   . Coronary artery disease    stent placed 2006  . Diabetes mellitus without complication (HCC)   . GERD (gastroesophageal reflux disease)   . History of hiatal hernia   . History of kidney stones   . Hypertension   . Kidney stones   . Neuromuscular disorder (HCC)   . Type 2 diabetes mellitus (HCC)     Past Surgical History:  Procedure Laterality Date  . ANTERIOR LAT LUMBAR FUSION Right 03/19/2019    Procedure: Right Lumbar One-Two Lumbar Two-Three Lumbar Three-Four Anterolateral lumbar interbody fusion with percutaneous pedicle screws, exploration and revision of adjacent level fusion;  Surgeon: Maeola HarmanStern, Joseph, MD;  [facility];  Service: Neurosurgery;  Laterality: Right;  Right Lumbar One-Two Lumbar Two-Three Lumbar Three-Four Anterolateral lumbar interbody fusion with percutaneous pedicle  . CORONARY ANGIOPLASTY    . LUMBAR PERCUTANEOUS PEDICLE SCREW 3 LEVEL N/A 03/19/2019   Procedure: LUMBAR PERCUTANEOUS PEDICLE SCREW THREE LEVEL;  Surgeon: Maeola HarmanStern, Joseph, MD;  [facility];  Service: Neurosurgery;  Laterality: N/A;  LUMBAR PERCUTANEOUS PEDICLE SCREW THREE LEVEL  . MASTOIDECTOMY     mastoid cyst removed  . MAXIMUM ACCESS (MAS)POSTERIOR LUMBAR INTERBODY FUSION (PLIF) 1 LEVEL N/A 03/10/2015   Procedure: Lumbar four-five Maximum access posterior lumbar interbody fusion;  Surgeon: Maeola HarmanJoseph Stern, MD;  Location: [facility];  Service: Neurosurgery;  Laterality: N/A;  Lumbar four-five Maximum access posterior lumbar interbody fusion  . righ arm surgery    . TONSILLECTOMY       reports that he has quit smoking. He has never used smokeless tobacco. He reports that he does not drink alcohol or use drugs.  Allergies  Allergen Reactions  . Ivp Dye [Iodinated Diagnostic Agents] Nausea And Vomiting    Family History  Family history unknown: Yes    Prior to Admission medications   Medication Sig Start Date End Date Taking? Authorizing Provider  atorvastatin (LIPITOR) 40 MG tablet Take 20 mg by mouth at bedtime.  [provider]  carvedilol (COREG) 3.125 MG tablet Take 3.125 mg by mouth daily.     [provider]  clopidogrel (PLAVIX) 75 MG tablet Take 75 mg by mouth daily.    [provider]  desonide (DESOWEN) 0.05 % lotion Apply 1 application topically 2 (two) times daily as needed (rash on face).     [provider]  DULoxetine (CYMBALTA) 60 MG  capsule Take 60 mg by mouth daily.    [provider]  glimepiride (AMARYL) 2 MG tablet Take 2 mg by mouth at bedtime.     [provider]  ipratropium (ATROVENT) 0.06 % nasal spray Place 2 sprays into both nostrils 2 (two) times daily.    [provider]  lisinopril-hydrochlorothiazide (PRINZIDE,ZESTORETIC) 20-12.5 MG tablet Take 1 tablet by mouth daily.    [provider]  metFORMIN (GLUCOPHAGE) 500 MG tablet Take 500 mg by mouth 2 (two) times daily with a meal.     [provider]  methocarbamol (ROBAXIN) 750 MG tablet Take 1 tablet (750 mg total) by mouth every 6 (six) hours as needed for muscle spasms. 03/20/19   Shirlean Kelly, MD  oxyCODONE (OXY IR/ROXICODONE) 5 MG immediate release tablet Take 1-2 tablets (5-10 mg total) by mouth every 3 (three) hours as needed (pain). 03/20/19   Shirlean Kelly, MD  pantoprazole (PROTONIX) 40 MG tablet Take 40 mg by mouth daily.    [provider]    Physical Exam: Constitutional: Moderately built and nourished. Vitals:   03/25/19 2230 03/25/19 2245 03/25/19 2300 03/25/19 2343  BP:   133/72 133/72  Pulse: 82 84 (!) 109 86  Resp:    18  Temp:      TempSrc:      SpO2: 96% 91% (!) 85% 95%  Weight:      Height:       Eyes: Anicteric no pallor. ENMT: No discharge from the ears eyes nose or mouth. Neck: No mass felt.  No neck rigidity. Respiratory: No rhonchi or crepitations. Cardiovascular: S1-S2 heard. Abdomen: Mild distended nontender bowel sounds feeble.  No guarding or rigidity. Musculoskeletal: No edema. Skin: No rash. Neurologic: Alert awake oriented to time place and person.  Moves all extremities. Psychiatric: Appears normal per normal affect.   Labs on Admission: I have personally reviewed following labs and imaging studies  CBC: Recent Labs  Lab 03/25/19 1318  WBC 9.8  HGB 11.1*  HCT 34.3*  MCV 90.7  PLT 421*   Basic Metabolic Panel: Recent Labs  Lab 03/25/19 1318   NA 133*  K 3.1*  CL 92*  CO2 17*  GLUCOSE 169*  BUN 25*  CREATININE 1.72*  CALCIUM 8.5*   GFR: Estimated Creatinine Clearance: 45 mL/min (A) (by C-G formula based on SCr of 1.72 mg/dL (H)). Liver Function Tests: Recent Labs  Lab 03/25/19 1318  AST 25  ALT 18  ALKPHOS 74  BILITOT 1.1  PROT 6.1*  ALBUMIN 3.0*   Recent Labs  Lab 03/25/19 1318  LIPASE 15   No results for input(s): AMMONIA in the last 168 hours. Coagulation Profile: No results for input(s): INR, PROTIME in the last 168 hours. Cardiac Enzymes: No results for input(s): CKTOTAL, CKMB, CKMBINDEX, TROPONINI in the last 168 hours. BNP (last 3 results) No results for input(s): PROBNP in the last 8760 hours. HbA1C: No results for input(s): HGBA1C in the last 72 hours. CBG: Recent Labs  Lab 03/19/19 0558 03/19/19 1327 03/19/19 1557 03/19/19 2053 03/20/19 8469  GLUCAP  126* 171* 219* 245* 126*   Lipid Profile: No results for input(s): CHOL, HDL, LDLCALC, TRIG, CHOLHDL, LDLDIRECT in the last 72 hours. Thyroid Function Tests: No results for input(s): TSH, T4TOTAL, FREET4, T3FREE, THYROIDAB in the last 72 hours. Anemia Panel: No results for input(s): VITAMINB12, FOLATE, FERRITIN, TIBC, IRON, RETICCTPCT in the last 72 hours. Urine analysis:    Component Value Date/Time   COLORURINE YELLOW 03/25/2019 1800   APPEARANCEUR CLEAR 03/25/2019 1800   LABSPEC 1.018 03/25/2019 1800   PHURINE 5.0 03/25/2019 1800   GLUCOSEU NEGATIVE 03/25/2019 1800   HGBUR NEGATIVE 03/25/2019 1800   BILIRUBINUR NEGATIVE 03/25/2019 1800   KETONESUR NEGATIVE 03/25/2019 1800   PROTEINUR NEGATIVE 03/25/2019 1800   NITRITE NEGATIVE 03/25/2019 1800   LEUKOCYTESUR NEGATIVE 03/25/2019 1800   Sepsis Labs: @LABRCNTIP (procalcitonin:4,lacticidven:4) ) Recent Results (from the past 240 hour(s))  Surgical pcr screen     Status: None   Collection Time: 03/16/19  1:57 PM   Specimen: Nasal Mucosa; Nasal Swab  Result Value Ref Range Status    MRSA, PCR NEGATIVE NEGATIVE Final   Staphylococcus aureus NEGATIVE NEGATIVE Final    Comment: (NOTE) The Xpert SA Assay (FDA approved for NASAL specimens in patients 65 years of age and older), is one component of a comprehensive surveillance program. It is not intended to diagnose infection nor to guide or monitor treatment. Performed at [facility], [place] [address] [place], [place] [address]   Novel Coronavirus, NAA (Hosp order, Send-out to Ref Lab; TAT 18-24 hrs     Status: None   Collection Time: 03/16/19  2:29 PM   Specimen: Nasopharyngeal Swab; Respiratory  Result Value Ref Range Status   SARS-CoV-2, NAA NOT DETECTED NOT DETECTED Final    Comment: (NOTE) This nucleic acid amplification test was developed and its performance characteristics determined by [employer]. Nucleic acid amplification tests include PCR and TMA. This test has not been FDA cleared or approved. This test has been authorized by FDA under an Emergency Use Authorization (EUA). This test is only authorized for the duration of time the declaration that circumstances exist justifying the authorization of the emergency use of in vitro diagnostic tests for detection of SARS-CoV-2 virus and/or diagnosis of COVID-19 infection under section 564(b)(1) of the Act, 21 U.S.C. 417EYC-1(K) (1), unless the authorization is terminated or revoked sooner. When diagnostic testing is negative, the possibility of a false negative result should be considered in the context of a patient's recent exposures and the presence of clinical signs and symptoms consistent with COVID-19. An individual without symptoms of COVID- 19 and who is not shedding SARS-CoV-2 vi rus would expect to have a negative (not detected) result in this assay. Performed At: [facility] [address] [place], [place] [id number] Rush Farmer MD HF:[id number]    [place]  Final    Comment: Performed at  [facility], [place] [address] [place], [place] [address]     Radiological Exams on Admission: CT ABDOMEN PELVIS WO CONTRAST  Result Date: 03/25/2019 CLINICAL DATA:  Acute abdominal pain. Back surgery on 03/19/2019. Persistent back pain. EXAM: CT ABDOMEN AND PELVIS WITHOUT CONTRAST TECHNIQUE: Multidetector CT imaging of the abdomen and pelvis was performed following the standard protocol without IV contrast. COMPARISON:  None. FINDINGS: Lower chest: No acute findings. There is subcutaneous air along right posterolateral lower chest wall. Hepatobiliary: Liver normal in size. There are several subcentimeter low-density liver lesions not fully characterized on this unenhanced study, but likely cysts. No  other liver masses or lesions. Gallbladder is distended. No stones or wall thickening. No bile duct dilation. Pancreas: Unremarkable. No pancreatic ductal dilatation or surrounding inflammatory changes. Spleen: Normal in size without focal abnormality. Adrenals/Urinary Tract: No adrenal masses. Kidneys normal in size, orientation and position. Low-density mass arises from the mid to upper pole the right kidney, 2.2 cm, consistent a cyst. No other renal masses, no stones and no hydronephrosis. Normal ureters. Normal bladder. Stomach/Bowel: Colon is mildly distended, maximum diameter of 7.6 cm. There are colonic air-fluid levels. No colonic wall thickening or adjacent inflammation. No evidence of obstruction. Small bowel is normal in caliber. No small bowel wall thickening inflammation. Small hiatal hernia. Stomach otherwise unremarkable. No evidence of appendicitis. Vascular/Lymphatic: Aortic atherosclerosis. No aneurysm. No enlarged lymph nodes. Reproductive: Enlarged prostate, 5.7 x 4.5 x 4.7 cm. Other: Soft tissue air extends the right psoas muscle, into the superior right retroperitoneum adjacent to the posteromedial right hemidiaphragm and right adrenal gland. Soft tissue air also is seen  posterolaterally in the pelvis, posterior to the right colon and extending adjacent to the iliacus muscle. Stranding is seen in the fat adjacent to the right psoas muscle in its upper extent. Small amount subcutaneous air along right posterolateral abdominal wall extending to the chest. No formed fluid collection is seen indicate an abscess. No air is visualized within the spinal canal. No air is seen along posterior paraspinal soft tissues. No free intraperitoneal air.  No ascites. Musculoskeletal: Well-positioned pedicle screws are noted from L1 through L4. There are metal intervertebral cage is well centered at the L1-L2, L2-L3 and L3-L4 levels. Intervertebral bone graft material noted at L4-L5 and L5-S1. No fracture. There is no bone resorption to suggest osteomyelitis. No evidence of loosening of the orthopedic hardware. In the posterior paraspinal soft tissues is no visualized fluid collection to suggest an abscess. IMPRESSION: 1. There is soft tissue air extends along mid to upper right psoas muscle, into the superior right retroperitoneum, in the extraperitoneal soft tissues of the right pelvis adjacent to the iliacus muscle, and along the posterolateral abdominal wall extending to the chest. The source of this area is not defined. There is some inflammation adjacent to the superior right psoas muscle. No convincing abscess. 2. Colon is mildly distended with air-fluid levels. This is consistent a mild colonic adynamic ileus. 3. No other acute abnormality within the abdomen or pelvis. Electronically Signed   By: Amie Portland M.D.   On: 03/25/2019 18:39     Assessment/Plan Principal Problem:   Adynamic ileus (HCC) Active Problems:   AKI (acute kidney injury) (HCC)   Hypokalemia    1. Adynamic ileus -status post recent surgery.  We will try to avoid narcotics as much as possible replace electrolytes follow metabolic panel hydrate and ambulate as much as possible.  KUB in the morning.  Presently  n.p.o except medications. 2. Acute renal failure with hyperkalemia likely from dehydration and poor oral intake.  Will hold patient's lisinopril and hydrochlorothiazide.  Continue with IV fluids replace potassium follow metabolic panel magnesium levels. 3. Recent lumbar fusion surgery first lumbar stenosis being followed by neurosurgeon Dr. Lovell Sheehan was notified by ER physician.  Will be seeing patient in consult.  CT scan as per the neurosurgery was postoperative changes. 4. Diabetes mellitus type 2 we will keep patient on sliding scale coverage. 5. Hypertension on beta-blockers and holding lisinopril and hydrochlorothiazide due to renal failure.  As needed IV labetalol. 6. CAD denies any chest pain on beta-blockers statins  and antiplatelet agents.  Given that patient has acute renal failure with ileus will need more than 2 midnight stay in inpatient status.   DVT prophylaxis: Heparin. Code Status: Full code. Family Communication: Discussed with patient. Disposition Plan: Home. Consults called: Neurosurgery was consulted by ER physician. Admission status: Inpatient.   Eduard Clos MD [facility] Pager [phone].  If 7PM-7AM, please contact night-coverage [email or web address] Password [email or web address]  03/26/2019, 12:16 AM

## 2019-03-26 NOTE — ED Notes (Signed)
Patient transported to X-ray 

## 2019-03-26 NOTE — ED Notes (Signed)
Pt reporting 10/10 back and abdominal pain. Attending physician paged by this RN.

## 2019-03-26 NOTE — ED Notes (Signed)
Checked CBG 125, RN Will informed

## 2019-03-26 NOTE — Plan of Care (Signed)
This is a 73 year old male admitted with ileus.  He has history of type 2 diabetes hypertension and CAD.  He had lumbar fusion surgery 6 days prior to admission to hospital.  He for the last 3 days has not had a bowel movement complaint of increasing abdominal distention and having very minimal flatus. He was found to have adynamic ileus by CT scan.  There was a concern for some air seen in the right psoas muscle which the ED physician spoke with Dr. Arnoldo Morale.  They will see patient in follow-up as a consult.  Per neurosurgery those findings were consistent with the recent surgery. Patient continues to have distended abdomen with no flatus He will need to be n.p.o. IV fluids minimize narcotics Replete potassium 3.4 magnesium 1.8 replete Out of bed ambulate PT consult

## 2019-03-26 NOTE — Progress Notes (Signed)
Marcus Carlson is a 73 y.o. male patient admitted from ED awake, alert - oriented  X 4 - no acute distress noted.  VSS - Blood pressure 133/82, pulse 89, temperature 98.3 F (36.8 C), temperature source Oral, resp. rate 16, height 6\' 3"  (1.905 m), weight 99.2 kg, SpO2 97 %.    IV in place, occlusive dsg intact without redness.  Orientation to room, and floor completed with information packet given to patient/family.  Patient declined safety video at this time.  Admission INP armband ID verified with patient/family, and in place.    SR up x 2, fall assessment complete, with patient and family able to verbalize understanding of risk associated with falls, and verbalized understanding to call nsg before up out of bed.    Call light within reach, patient able to voice, and demonstrate understanding.  Skin to skin check completed with second RN. Skin integrity documented.    No evidence of skin break down noted on exam.     Will cont to eval and treat per MD orders.  Howard Pouch, RN 03/26/2019 7:39 PM

## 2019-03-27 ENCOUNTER — Inpatient Hospital Stay (HOSPITAL_COMMUNITY): Payer: Medicare Other

## 2019-03-27 ENCOUNTER — Other Ambulatory Visit: Payer: Self-pay

## 2019-03-27 LAB — BASIC METABOLIC PANEL
Anion gap: 11 (ref 5–15)
BUN: 14 mg/dL (ref 8–23)
CO2: 28 mmol/L (ref 22–32)
Calcium: 8.3 mg/dL — ABNORMAL LOW (ref 8.9–10.3)
Chloride: 98 mmol/L (ref 98–111)
Creatinine, Ser: 1.13 mg/dL (ref 0.61–1.24)
GFR calc Af Amer: >60 mL/min (ref >60)
GFR calc non Af Amer: >60 mL/min (ref >60)
Glucose, Bld: 126 mg/dL — ABNORMAL HIGH (ref 70–99)
Potassium: 3.4 mmol/L — ABNORMAL LOW (ref 3.5–5.1)
Sodium: 137 mmol/L (ref 135–145)

## 2019-03-27 LAB — GLUCOSE, CAPILLARY
Glucose-Capillary: 104 mg/dL — ABNORMAL HIGH (ref 70–99)
Glucose-Capillary: 110 mg/dL — ABNORMAL HIGH (ref 70–99)
Glucose-Capillary: 110 mg/dL — ABNORMAL HIGH (ref 70–99)
Glucose-Capillary: 128 mg/dL — ABNORMAL HIGH (ref 70–99)
Glucose-Capillary: 143 mg/dL — ABNORMAL HIGH (ref 70–99)
Glucose-Capillary: 178 mg/dL — ABNORMAL HIGH (ref 70–99)

## 2019-03-27 LAB — MAGNESIUM: Magnesium: 2 mg/dL (ref 1.7–2.4)

## 2019-03-27 MED ORDER — KETOROLAC TROMETHAMINE 15 MG/ML IJ SOLN
15.0000 mg | Freq: Four times a day (QID) | INTRAMUSCULAR | Status: AC
  Administered 2019-03-27 – 2019-03-29 (×8): 15 mg via INTRAVENOUS
  Filled 2019-03-27 (×8): qty 1

## 2019-03-27 MED ORDER — POTASSIUM CHLORIDE 10 MEQ/100ML IV SOLN
10.0000 meq | INTRAVENOUS | Status: AC
  Administered 2019-03-27 (×4): 10 meq via INTRAVENOUS
  Filled 2019-03-27 (×4): qty 100

## 2019-03-27 MED ORDER — SODIUM CHLORIDE 0.9 % IV SOLN
INTRAVENOUS | Status: DC | PRN
  Administered 2019-03-27: 250 mL via INTRAVENOUS

## 2019-03-27 MED ORDER — MORPHINE SULFATE (PF) 2 MG/ML IV SOLN
1.0000 mg | INTRAVENOUS | Status: AC | PRN
  Administered 2019-03-27 (×2): 1 mg via INTRAVENOUS
  Filled 2019-03-27 (×2): qty 1

## 2019-03-27 MED ORDER — INSULIN ASPART 100 UNIT/ML ~~LOC~~ SOLN
0.0000 [IU] | Freq: Three times a day (TID) | SUBCUTANEOUS | Status: DC
  Administered 2019-03-27: 12:00:00 3 [IU] via SUBCUTANEOUS
  Administered 2019-03-28: 2 [IU] via SUBCUTANEOUS
  Administered 2019-03-28: 3 [IU] via SUBCUTANEOUS
  Administered 2019-03-29 – 2019-03-30 (×3): 2 [IU] via SUBCUTANEOUS

## 2019-03-27 NOTE — Consult Note (Signed)
. Reason for Consult: Recent 3 level ALIF; Post-operative ileus Referring Physician: Dr. Murrell Converse is an 73 y.o. male.  HPI: Patient underwent an L1-2, L2-3, L3-4 right-sided ALIF on 03/19/2019 by Dr. Vertell Limber. He was discharged home on 03/20/2019. He has been having difficulty with passing stool since surgery. He has had abdominal pain that is cramping in nature. He has expected post operative back pain and bruising near his incisions. He denies weakness or radicular pain in his lower extremities. He denies incontinence of bowel or bladder.  Past Medical History:  Diagnosis Date  . Arthritis   . Coronary artery disease    stent placed 2006  . Diabetes mellitus without complication ([place])   . GERD (gastroesophageal reflux disease)   . History of hiatal hernia   . History of kidney stones   . Hypertension   . Kidney stones   . Neuromuscular disorder ([place])   . Type 2 diabetes mellitus ([place])     Past Surgical History:  Procedure Laterality Date  . ANTERIOR LAT LUMBAR FUSION Right 03/19/2019   Procedure: Right Lumbar One-Two Lumbar Two-Three Lumbar Three-Four Anterolateral lumbar interbody fusion with percutaneous pedicle screws, exploration and revision of adjacent level fusion;  Surgeon: Erline Levine, MD;  Location: [place];  Service: Neurosurgery;  Laterality: Right;  Right Lumbar One-Two Lumbar Two-Three Lumbar Three-Four Anterolateral lumbar interbody fusion with percutaneous pedicle  . CORONARY ANGIOPLASTY    . LUMBAR PERCUTANEOUS PEDICLE SCREW 3 LEVEL N/A 03/19/2019   Procedure: LUMBAR PERCUTANEOUS PEDICLE SCREW THREE LEVEL;  Surgeon: Erline Levine, MD;  Location: [facility];  Service: Neurosurgery;  Laterality: N/A;  LUMBAR PERCUTANEOUS PEDICLE SCREW THREE LEVEL  . MASTOIDECTOMY     mastoid cyst removed  . MAXIMUM ACCESS (MAS)POSTERIOR LUMBAR INTERBODY FUSION (PLIF) 1 LEVEL N/A 03/10/2015   Procedure: Lumbar four-five Maximum access posterior lumbar interbody fusion;  Surgeon:  Erline Levine, MD;  Location: [facility];  Service: Neurosurgery;  Laterality: N/A;  Lumbar four-five Maximum access posterior lumbar interbody fusion  . righ arm surgery    . TONSILLECTOMY      Family History  Family history unknown: Yes    Social History:  reports that he has quit smoking. He has never used smokeless tobacco. He reports that he does not drink alcohol or use drugs.  Allergies:  Allergies  Allergen Reactions  . Ivp Dye [Iodinated Diagnostic Agents] Nausea And Vomiting    Medications: I have reviewed the patient's current medications.  Results for orders placed or performed during the hospital encounter of 03/25/19 (from the past 48 hour(s))  Lipase, blood     Status: None   Collection Time: 03/25/19  1:18 PM  Result Value Ref Range   Lipase 15 11 - 51 U/L    Comment: Performed at [facility], [place] [address] [place],  [address]  Comprehensive metabolic panel     Status: Abnormal   Collection Time: 03/25/19  1:18 PM  Result Value Ref Range   Sodium 133 (L) 135 - 145 mmol/L   Potassium 3.1 (L) 3.5 - 5.1 mmol/L   Chloride 92 (L) 98 - 111 mmol/L   CO2 17 (L) 22 - 32 mmol/L   Glucose, Bld 169 (H) 70 - 99 mg/dL   BUN 25 (H) 8 - 23 mg/dL   Creatinine, Ser 1.72 (H) 0.61 - 1.24 mg/dL   Calcium 8.5 (L) 8.9 - 10.3 mg/dL   Total Protein 6.1 (L) 6.5 - 8.1 g/dL  Albumin 3.0 (L) 3.5 - 5.0 g/dL   AST 25 15 - 41 U/L   ALT 18 0 - 44 U/L   Alkaline Phosphatase 74 38 - 126 U/L   Total Bilirubin 1.1 0.3 - 1.2 mg/dL   GFR calc non Af Amer 38 (L) >60 mL/min   GFR calc Af Amer 44 (L) >60 mL/min   Anion gap 24 (H) 5 - 15    Comment: Performed at [facility], [address], [place], [place] [address]  CBC     Status: Abnormal   Collection Time: 03/25/19  1:18 PM  Result Value Ref Range   WBC 9.8 4.0 - 10.5 K/uL   RBC 3.78 (L) 4.22 - 5.81 MIL/uL   Hemoglobin 11.1 (L) 13.0 - 17.0 g/dL   HCT 71.0 (L) 62.6 - 94.8 %   MCV 90.7 80.0 - 100.0 fL   MCH 29.4  26.0 - 34.0 pg   MCHC 32.4 30.0 - 36.0 g/dL   RDW 54.6 27.0 - 35.0 %   Platelets 421 (H) 150 - 400 K/uL   nRBC 0.0 0.0 - 0.2 %    Comment: Performed at [facility], [address], [place], [place] [address]  Urinalysis, Routine w reflex microscopic     Status: None   Collection Time: 03/25/19  6:00 PM  Result Value Ref Range   Color, Urine YELLOW YELLOW   APPearance CLEAR CLEAR   Specific Gravity, Urine 1.018 1.005 - 1.030   pH 5.0 5.0 - 8.0   Glucose, UA NEGATIVE NEGATIVE mg/dL   Hgb urine dipstick NEGATIVE NEGATIVE   Bilirubin Urine NEGATIVE NEGATIVE   Ketones, ur NEGATIVE NEGATIVE mg/dL   Protein, ur NEGATIVE NEGATIVE mg/dL   Nitrite NEGATIVE NEGATIVE   Leukocytes,Ua NEGATIVE NEGATIVE    Comment: Performed at [facility], [address], [place], [place] [address]  Lactic acid, plasma     Status: None   Collection Time: 03/25/19  6:00 PM  Result Value Ref Range   Lactic Acid, Venous 1.1 0.5 - 1.9 mmol/L    Comment: Performed at [facility], [address], [place], [place] [address]  Urine culture     Status: None   Collection Time: 03/25/19  6:10 PM   Specimen: Urine, Random  Result Value Ref Range   Specimen Description URINE, RANDOM    Special Requests NONE    Culture      NO GROWTH Performed at [facility], [address] [place], [place] [address]    Report Status 03/26/2019 FINAL   Lactic acid, plasma     Status: None   Collection Time: 03/25/19 10:13 PM  Result Value Ref Range   Lactic Acid, Venous 1.1 0.5 - 1.9 mmol/L    Comment: Performed at [facility], [address], [place], [place] [address]  SARS CORONAVIRUS 2 (TAT 6-24 HRS) Nasopharyngeal Nasopharyngeal Swab     Status: None   Collection Time: 03/25/19 10:14 PM   Specimen: Nasopharyngeal Swab  Result Value Ref Range   SARS Coronavirus 2 NEGATIVE NEGATIVE    Comment: (NOTE) SARS-CoV-2 target nucleic acids are NOT DETECTED. The SARS-CoV-2 RNA is generally  detectable in upper and lower respiratory specimens during the acute phase of infection. Negative results do not preclude SARS-CoV-2 infection, do not rule out co-infections with other pathogens, and should not be used as the sole basis for treatment or other patient management decisions. Negative results must be  combined with clinical observations, patient history, and epidemiological information. The expected result is Negative. Fact Sheet for Patients: HairSlick.no Fact Sheet for Healthcare Providers: quierodirigir.com This test is not yet approved or cleared by the [place] FDA and  has been authorized for detection and/or diagnosis of SARS-CoV-2 by FDA under an Emergency Use Authorization (EUA). This EUA will remain  in effect (meaning this test can be used) for the duration of the COVID-19 declaration under Section 56 4(b)(1) of the Act, 21 U.S.C. section 360bbb-3(b)(1), unless the authorization is terminated or revoked sooner. Performed at [facility], [address] [place], [place] [address]   CBG monitoring, ED     Status: Abnormal   Collection Time: 03/26/19 12:49 AM  Result Value Ref Range   Glucose-Capillary 125 (H) 70 - 99 mg/dL   Comment 1 Notify RN   CBC     Status: Abnormal   Collection Time: 03/26/19  1:16 AM  Result Value Ref Range   WBC 9.5 4.0 - 10.5 K/uL   RBC 3.80 (L) 4.22 - 5.81 MIL/uL   Hemoglobin 11.1 (L) 13.0 - 17.0 g/dL   HCT 60.4 (L) 54.0 - 98.1 %   MCV 90.8 80.0 - 100.0 fL   MCH 29.2 26.0 - 34.0 pg   MCHC 32.2 30.0 - 36.0 g/dL   RDW 19.1 47.8 - 29.5 %   Platelets 411 (H) 150 - 400 K/uL   nRBC 0.0 0.0 - 0.2 %    Comment: Performed at [facility], [address] [place], [place] [address]  Creatinine, serum     Status: Abnormal   Collection Time: 03/26/19  1:16 AM  Result Value Ref Range   Creatinine, Ser 1.47 (H) 0.61 - 1.24 mg/dL   GFR calc non Af Amer 46 (L) >60 mL/min    GFR calc Af Amer 54 (L) >60 mL/min    Comment: Performed at [facility], [address] [place], [place] [address]  Basic metabolic panel     Status: Abnormal   Collection Time: 03/26/19  4:18 AM  Result Value Ref Range   Sodium 138 135 - 145 mmol/L   Potassium 3.2 (L) 3.5 - 5.1 mmol/L   Chloride 97 (L) 98 - 111 mmol/L   CO2 28 22 - 32 mmol/L   Glucose, Bld 139 (H) 70 - 99 mg/dL   BUN 22 8 - 23 mg/dL   Creatinine, Ser 4.69 (H) 0.61 - 1.24 mg/dL   Calcium 8.4 (L) 8.9 - 10.3 mg/dL   GFR calc non Af Amer 50 (L) >60 mL/min   GFR calc Af Amer 58 (L) >60 mL/min   Anion gap 13 5 - 15    Comment: Performed at [facility], [address], [place], [place] [address]  CBC     Status: Abnormal   Collection Time: 03/26/19  4:18 AM  Result Value Ref Range   WBC 7.9 4.0 - 10.5 K/uL   RBC 3.64 (L) 4.22 - 5.81 MIL/uL   Hemoglobin 10.6 (L) 13.0 - 17.0 g/dL   HCT 84.1 (L) 32.4 - 40.1 %   MCV 91.8 80.0 - 100.0 fL   MCH 29.1 26.0 - 34.0 pg   MCHC 31.7 30.0 - 36.0 g/dL   RDW 02.7 25.3 - 66.4 %   Platelets 410 (H) 150 - 400 K/uL   nRBC 0.0 0.0 - 0.2 %    Comment: Performed at [facility], [address]  [place], [place] [address]  TSH     Status: None   Collection Time: 03/26/19  4:18 AM  Result Value Ref Range   TSH 0.704 0.350 - 4.500 uIU/mL    Comment: Performed by a 3rd Generation assay with a functional sensitivity of <=0.01 uIU/mL. Performed at [facility], [address], [place], [place] [address]   Magnesium     Status: None   Collection Time: 03/26/19  4:18 AM  Result Value Ref Range   Magnesium 1.8 1.7 - 2.4 mg/dL    Comment: Performed at [facility], [address], [place], [place] [address]  CBG monitoring, ED     Status: Abnormal   Collection Time: 03/26/19  4:29 AM  Result Value Ref Range   Glucose-Capillary 131 (H) 70 - 99 mg/dL  CBG monitoring, ED     Status: Abnormal   Collection Time: 03/26/19 10:44 AM  Result Value Ref Range    Glucose-Capillary 116 (H) 70 - 99 mg/dL   Comment 1 Notify RN    Comment 2 Document in Chart   CBG monitoring, ED     Status: Abnormal   Collection Time: 03/26/19 12:10 PM  Result Value Ref Range   Glucose-Capillary 140 (H) 70 - 99 mg/dL   Comment 1 Notify RN    Comment 2 Document in Chart   Glucose, capillary     Status: Abnormal   Collection Time: 03/26/19  8:40 PM  Result Value Ref Range   Glucose-Capillary 115 (H) 70 - 99 mg/dL  Glucose, capillary     Status: Abnormal   Collection Time: 03/27/19 12:51 AM  Result Value Ref Range   Glucose-Capillary 104 (H) 70 - 99 mg/dL  Basic metabolic panel     Status: Abnormal   Collection Time: 03/27/19  3:48 AM  Result Value Ref Range   Sodium 137 135 - 145 mmol/L   Potassium 3.4 (L) 3.5 - 5.1 mmol/L   Chloride 98 98 - 111 mmol/L   CO2 28 22 - 32 mmol/L   Glucose, Bld 126 (H) 70 - 99 mg/dL   BUN 14 8 - 23 mg/dL   Creatinine, Ser 9.14 0.61 - 1.24 mg/dL   Calcium 8.3 (L) 8.9 - 10.3 mg/dL   GFR calc non Af Amer >60 >60 mL/min   GFR calc Af Amer >60 >60 mL/min   Anion gap 11 5 - 15    Comment: Performed at [facility], [address], [place], [place] [address]  Magnesium     Status: None   Collection Time: 03/27/19  3:48 AM  Result Value Ref Range   Magnesium 2.0 1.7 - 2.4 mg/dL    Comment: Performed at [facility], [address] [place], [place] [address]  Glucose, capillary     Status: Abnormal   Collection Time: 03/27/19  4:23 AM  Result Value Ref Range   Glucose-Capillary 110 (H) 70 - 99 mg/dL  Glucose, capillary     Status: Abnormal   Collection Time: 03/27/19  8:07 AM  Result Value Ref Range   Glucose-Capillary 143 (H) 70 - 99 mg/dL    CT ABDOMEN PELVIS WO CONTRAST  Result Date: 03/25/2019 CLINICAL DATA:  Acute abdominal pain. Back surgery on 03/19/2019. Persistent back pain. EXAM: CT ABDOMEN AND PELVIS WITHOUT CONTRAST TECHNIQUE: Multidetector CT imaging of the abdomen and pelvis was performed following  the standard protocol without IV contrast. COMPARISON:  None. FINDINGS: Lower chest: No acute findings. There  is subcutaneous air along right posterolateral lower chest wall. Hepatobiliary: Liver normal in size. There are several subcentimeter low-density liver lesions not fully characterized on this unenhanced study, but likely cysts. No other liver masses or lesions. Gallbladder is distended. No stones or wall thickening. No bile duct dilation. Pancreas: Unremarkable. No pancreatic ductal dilatation or surrounding inflammatory changes. Spleen: Normal in size without focal abnormality. Adrenals/Urinary Tract: No adrenal masses. Kidneys normal in size, orientation and position. Low-density mass arises from the mid to upper pole the right kidney, 2.2 cm, consistent a cyst. No other renal masses, no stones and no hydronephrosis. Normal ureters. Normal bladder. Stomach/Bowel: Colon is mildly distended, maximum diameter of 7.6 cm. There are colonic air-fluid levels. No colonic wall thickening or adjacent inflammation. No evidence of obstruction. Small bowel is normal in caliber. No small bowel wall thickening inflammation. Small hiatal hernia. Stomach otherwise unremarkable. No evidence of appendicitis. Vascular/Lymphatic: Aortic atherosclerosis. No aneurysm. No enlarged lymph nodes. Reproductive: Enlarged prostate, 5.7 x 4.5 x 4.7 cm. Other: Soft tissue air extends the right psoas muscle, into the superior right retroperitoneum adjacent to the posteromedial right hemidiaphragm and right adrenal gland. Soft tissue air also is seen posterolaterally in the pelvis, posterior to the right colon and extending adjacent to the iliacus muscle. Stranding is seen in the fat adjacent to the right psoas muscle in its upper extent. Small amount subcutaneous air along right posterolateral abdominal wall extending to the chest. No formed fluid collection is seen indicate an abscess. No air is visualized within the spinal canal. No air  is seen along posterior paraspinal soft tissues. No free intraperitoneal air.  No ascites. Musculoskeletal: Well-positioned pedicle screws are noted from L1 through L4. There are metal intervertebral cage is well centered at the L1-L2, L2-L3 and L3-L4 levels. Intervertebral bone graft material noted at L4-L5 and L5-S1. No fracture. There is no bone resorption to suggest osteomyelitis. No evidence of loosening of the orthopedic hardware. In the posterior paraspinal soft tissues is no visualized fluid collection to suggest an abscess. IMPRESSION: 1. There is soft tissue air extends along mid to upper right psoas muscle, into the superior right retroperitoneum, in the extraperitoneal soft tissues of the right pelvis adjacent to the iliacus muscle, and along the posterolateral abdominal wall extending to the chest. The source of this area is not defined. There is some inflammation adjacent to the superior right psoas muscle. No convincing abscess. 2. Colon is mildly distended with air-fluid levels. This is consistent a mild colonic adynamic ileus. 3. No other acute abnormality within the abdomen or pelvis. Electronically Signed   By: Amie Portland M.D.   On: 03/25/2019 18:39   DG Abd 1 View  Result Date: 03/26/2019 CLINICAL DATA:  Persistent back pain after surgery.  Abdominal pain. EXAM: ABDOMEN - 1 VIEW COMPARISON:  CT 03/25/2019. FINDINGS: Distended loops of small and large bowel noted suggesting adynamic ileus. Hemidiaphragms not imaged. No pathologic intra-abdominal calcifications. Postsurgical changes lumbar spine with hardware intact. Degenerative changes lumbar spine and both hips. No acute bony abnormality. IMPRESSION: 1. Distended loops of small and large bowel noted suggesting adynamic ileus. Follow-up exam to demonstrate resolution suggested. 2. Postsurgical changes lumbar spine. Hardware intact. Degenerative changes lumbar spine and both hips. No acute bony abnormality identified. Electronically Signed    By: Maisie Fus  Register   On: 03/26/2019 06:39   DG Abd Portable 1V  Result Date: 03/27/2019 CLINICAL DATA:  Ileus EXAM: PORTABLE ABDOMEN - 1 VIEW COMPARISON:  Yesterday FINDINGS: Continued  ileus pattern with unchanged degree of gaseous distension colon diffusely. No concerning mass effect or gas collection. Extensive spinal fusion. Left hip arthroplasty. IMPRESSION: Continued colonic ileus pattern Electronically Signed   By: Marnee SpringJonathon  Watts M.D.   On: 03/27/2019 08:49    Review of Systems  Constitutional: Positive for activity change and appetite change. Negative for chills, diaphoresis, fatigue, fever and unexpected weight change.  HENT: Negative.   Eyes: Negative.   Respiratory: Negative for cough, chest tightness, shortness of breath and wheezing.   Cardiovascular: Negative for chest pain, palpitations and leg swelling.  Gastrointestinal: Positive for abdominal distention, abdominal pain and constipation. Negative for diarrhea.  Endocrine: Negative.   Genitourinary: Negative for decreased urine volume, difficulty urinating, dysuria, flank pain and urgency.  Musculoskeletal: Positive for arthralgias, back pain and myalgias. Negative for neck pain.  Skin: Negative.   Neurological: Negative for dizziness, facial asymmetry, weakness, light-headedness, numbness and headaches.  Hematological: Negative.   Psychiatric/Behavioral: Negative.    Blood pressure 138/82, pulse 84, temperature 98.5 F (36.9 C), temperature source Oral, resp. rate 16, height 6\' 3"  (1.905 m), weight 98.7 kg, SpO2 95 %. Physical Exam  Constitutional: He is oriented to person, place, and time. He appears well-developed and well-nourished.  HENT:  Head: Normocephalic and atraumatic.  Eyes: Pupils are equal, round, and reactive to light. Conjunctivae and EOM are normal.  Cardiovascular: Normal rate and regular rhythm.  Respiratory: Effort normal. No respiratory distress.  GI: He exhibits distension. Bowel sounds are  decreased. There is generalized abdominal tenderness.  Musculoskeletal:        General: Normal range of motion.     Cervical back: Neck supple.     Lumbar back: Pain present.  Neurological: He is alert and oriented to person, place, and time. No cranial nerve deficit.  Skin: Skin is warm and dry. Ecchymosis noted.  Psychiatric: He has a normal mood and affect. His behavior is normal. Judgment and thought content normal.    Assessment/Plan: Mr. Daphine DeutscherMartin appears to be suffering from a post operative ileus. He reports he had a small bowel movement yesterday evening and this morning. He is passing gas. He has been advanced to a clear liquid diet. His abdominal CT scan shows expected post operative changes, along with features concerning for an adynamic ileus. I will add a low dose Toradol to see if this helps with his surgical and low back pain as he is limited on pain medications due to his ileus. We will continue to follow.  Floreen ComberMeghan D Baila Rouse 03/27/2019, 10:02 AM

## 2019-03-27 NOTE — Progress Notes (Signed)
Patient approved for clear liquid diet, entered per verbal with MD at bedside.  Pt provided menu/phone to order a tray.

## 2019-03-27 NOTE — Evaluation (Signed)
Physical Therapy Evaluation Patient Details Name: Marcus Carlson MRN: [id number] DOB: June 20, 1945 Today's Date: 03/27/2019   History of Present Illness  73 y.o. male with history of diabetes mellitus type 2, hypertension, CAD presents to the ER 6 days after lumbar fusion surgery with complaints of abdominal pain. CT scan showing Adynamic ileus.  Clinical Impression  Pt presents to PT with deficits in gait, balance, endurance, power, and with back pain. Pt limited some by back pain initially during session, but able to tolerate ambulating limited community distances after short seated rest. Pt dons brace independently, and performs all mobility with supervision during session. Pt is encouraged to ambulate out of the room at least 3 times per day for the remainder of hospitalization. PT recommends no DME or PT follow-up.    Follow Up Recommendations No PT follow up    Equipment Recommendations  None recommended by PT(pt owns necessary DME)    Recommendations for Other Services       Precautions / Restrictions Precautions Precautions: Back Precaution Booklet Issued: No Precaution Comments: reviewed precautions, pt verbalizes knowledge of them Required Braces or Orthoses: Spinal Brace Spinal Brace: Applied in sitting position Restrictions Weight Bearing Restrictions: No      Mobility  Bed Mobility Overal bed mobility: Needs Assistance Bed Mobility: Supine to Sit     Supine to sit: Supervision;HOB elevated        Transfers Overall transfer level: Needs assistance Equipment used: None Transfers: Sit to/from Stand Sit to Stand: Supervision            Ambulation/Gait Ambulation/Gait assistance: Supervision Gait Distance (Feet): 100 Feet(40', and 50' on 2 additional trials) Assistive device: IV Pole Gait Pattern/deviations: Step-to pattern;Wide base of support Gait velocity: reduced Gait velocity interpretation: 1.31 - 2.62 ft/sec, indicative of limited community  ambulator General Gait Details: pt with shortened step to gait, increasing gait speed and tolerance in final 2 trials  Stairs            Wheelchair Mobility    Modified Rankin (Stroke Patients Only)       Balance Overall balance assessment: Needs assistance Sitting-balance support: No upper extremity supported;Feet supported Sitting balance-Leahy Scale: Good     Standing balance support: Single extremity supported;During functional activity Standing balance-Leahy Scale: Good Standing balance comment: supervision                             Pertinent Vitals/Pain Pain Assessment: 0-10 Pain Score: 8  Pain Location: back and LE Pain Descriptors / Indicators: Aching Pain Intervention(s): Limited activity within patient's tolerance    Home Living Family/patient expects to be discharged to:: Private residence Living Arrangements: Spouse/significant other Available Help at Discharge: Family;Available 24 hours/day Type of Home: House Home Access: Ramped entrance     Home Layout: One level Home Equipment: Walker - 2 wheels;Hospital bed;Bedside commode;Cane - single point;Shower seat - built Scientist, clinical (histocompatibility and immunogenetics)      Prior Function Level of Independence: Independent with assistive device(s)         Comments: independent with cane since surgery, cares for wife and performs all IADLs     Hand Dominance   Dominant Hand: Right    Extremity/Trunk Assessment   Upper Extremity Assessment Upper Extremity Assessment: Overall WFL for tasks assessed    Lower Extremity Assessment Lower Extremity Assessment: Generalized weakness    Cervical / Trunk Assessment Cervical / Trunk Assessment: Other exceptions Cervical / Trunk Exceptions: s/p back surgery  Communication   Communication: No difficulties  Cognition Arousal/Alertness: Awake/alert Behavior During Therapy: WFL for tasks assessed/performed Overall Cognitive Status: Within Functional Limits for  tasks assessed                                        General Comments General comments (skin integrity, edema, etc.): VSS    Exercises     Assessment/Plan    PT Assessment Patient needs continued PT services  PT Problem List Decreased strength;Decreased activity tolerance;Decreased balance;Decreased mobility;Decreased knowledge of use of DME;Pain       PT Treatment Interventions DME instruction;Gait training;Functional mobility training;Therapeutic activities;Therapeutic exercise;Balance training;Neuromuscular re-education;Patient/family education    PT Goals (Current goals can be found in the Care Plan section)  Acute Rehab PT Goals Patient Stated Goal: to go home and improve abdominal pain PT Goal Formulation: With patient Time For Goal Achievement: 04/10/19 Potential to Achieve Goals: Good    Frequency Min 5X/week   Barriers to discharge        Co-evaluation               AM-PAC PT "6 Clicks" Mobility  Outcome Measure Help needed turning from your back to your side while in a flat bed without using bedrails?: None Help needed moving from lying on your back to sitting on the side of a flat bed without using bedrails?: None Help needed moving to and from a bed to a chair (including a wheelchair)?: None Help needed standing up from a chair using your arms (e.g., wheelchair or bedside chair)?: None Help needed to walk in hospital room?: None Help needed climbing 3-5 steps with a railing? : A Little 6 Click Score: 23    End of Session Equipment Utilized During Treatment: Back brace Activity Tolerance: Patient tolerated treatment well Patient left: in chair;with call bell/phone within reach Nurse Communication: Mobility status PT Visit Diagnosis: Other abnormalities of gait and mobility (R26.89);Pain Pain - Right/Left: Right Pain - part of body: Hip(also back)    Time: 3235-5732 PT Time Calculation (min) (ACUTE ONLY): 31 min   Charges:    PT Evaluation $PT Eval Low Complexity: 1 Low PT Treatments $Gait Training: 8-22 mins        Zenaida Niece, PT, DPT Acute Rehabilitation Pager: [phone]   Zenaida Niece 03/27/2019, 11:40 AM

## 2019-03-27 NOTE — Progress Notes (Signed)
PROGRESS NOTE    Marcus Carlson  KKX:[id number] DOB: 12-18-1945 DOA: 03/25/2019 PCP: Isabella Bowens, MD    Brief Narrative:73 y.o. male with history of diabetes mellitus type 2, hypertension, CAD presents to the ER 6 days after lumbar fusion surgery with complaints of abdominal pain.  Patient states that he has been having difficulty moving his bowel and generalized abdominal pain with no vomiting is able to take his regular food.  Last bowel movement was 2 days ago and had some today after Senokot.  Pain is crampy in nature diffuse abdominal.  Also has baseline low back pain.  Denies any weakness of extremities.  Denies chest pain or shortness of breath.  ED Course: In the ER patient's labs show acute renal failure with creatinine increasing from 1.3-1.7 hypokalemia of potassium 3.1 hemoglobin of 11.1 and Covid test was negative.  Given the abdominal pain patient had a CT abdomen pelvis which shows features concerning for adynamic ileus and also there is some air seen in the right psoas muscle extending to the soft tissues.  ER physician discussed with Dr. Lovell Sheehan on-call for Dr. Venetia Maxon neurosurgeon.  Per neurosurgeon these findings are consistent from recent surgery.  They will be following in consult.  Patient was started on IV fluids potassium replacement admitted for adynamic ileus.  Assessment & Plan:   Principal Problem:   Adynamic ileus (HCC) Active Problems:   AKI (acute kidney injury) (HCC)   Hypokalemia  #1 adynamic ileus status post recent surgery.  Patient reports having a bowel movement and passing flatus.  Denies any nausea vomiting. KUB still showing gaseous distention in the colon with ileus.  I have started him on clear liquids today.  I will keep him on clear liquids today.  Check KUB tomorrow.  Discussed with the staff importance of walking him walking him and walking him. Keep electrolytes normal.  K is 3.4 will replete.  Mag is normal. He received 2 doses of  morphine overnight for abdominal pain.  I have kept him on tramadol for severe pain. I have discussed with him to avoid narcotics if at all possible.  #2 lumbar fusion surgery 03/19/2019.  Dr. Venetia Maxon.  They were informed by ED physician of the admission.  #3 AKI with hyperkalemia due to poor p.o. intake with ACE inhibitor and hydrochlorothiazide which is on hold.  Creatinine today is 1.13 down from 1.37 down from 1.47 at the time of admission.  #4 type 2 diabetes blood sugars have been stable so far from 1 15-1 43 on SSI.  He takes Amaryl and Glucophage at home restart at the time of discharge.  #5 hypertension on Coreg 3.125 mg twice a day.  Blood pressure 138/82.  Continue Coreg. He was also on lisinopril hydrochlorothiazide 20 x 12.5 at home.  This is on hold.  #6 history of CAD continue Coreg, Plavix  #7 hyperlipidemia continue Lipitor  # miscellaneous continue Cymbalta.      Estimated body mass index is 27.19 kg/m as calculated from the following:   Height as of this encounter: 6\' 3"  (1.905 m).   Weight as of this encounter: 98.7 kg.  DVT prophylaxis: Subcu heparin  code Status: Full code  family Communication: None Disposition Plan: Pending clinical improvement  Consultants:   None  Procedures: None Antimicrobials: None  Subjective: Anxious to eat feeling hungry had a bowel movement last night and this morning no nausea vomiting  Objective: Vitals:   03/26/19 2100 03/27/19 0055 03/27/19 0433 03/27/19 0809  BP:  (!) 149/96 123/89 138/82  Pulse:  90 85 84  Resp:  20 20 16   Temp:  98.5 F (36.9 C) 98.5 F (36.9 C) 98.5 F (36.9 C)  TempSrc:  Oral Oral Oral  SpO2: 95% 95% 93% 95%  Weight:   98.7 kg   Height:        Intake/Output Summary (Last 24 hours) at 03/27/2019 0934 Last data filed at 03/27/2019 0918 Gross per 24 hour  Intake 2430.3 ml  Output 700 ml  Net 1730.3 ml   Filed Weights   03/25/19 2018 03/26/19 1707 03/27/19 0433  Weight: 99.8 kg  99.2 kg 98.7 kg    Examination:  General exam: Appears calm and comfortable  Respiratory system: Clear to auscultation. Respiratory effort normal. Cardiovascular system: S1 & S2 heard, RRR. No JVD, murmurs, rubs, gallops or clicks. No pedal edema. Gastrointestinal system: Abdomen is distended, soft and nontender. No organomegaly or masses felt. Normal bowel sounds heard. Central nervous system: Alert and oriented. No focal neurological deficits. Extremities: Symmetric 5 x 5 power. Skin: No rashes, lesions or ulcers Psychiatry: Judgement and insight appear normal. Mood & affect appropriate.     Data Reviewed: I have personally reviewed following labs and imaging studies  CBC: Recent Labs  Lab 03/25/19 1318 03/26/19 0116 03/26/19 0418  WBC 9.8 9.5 7.9  HGB 11.1* 11.1* 10.6*  HCT 34.3* 34.5* 33.4*  MCV 90.7 90.8 91.8  PLT 421* 411* 376*   Basic Metabolic Panel: Recent Labs  Lab 03/25/19 1318 03/26/19 0116 03/26/19 0418 03/27/19 0348  NA 133*  --  138 137  K 3.1*  --  3.2* 3.4*  CL 92*  --  97* 98  CO2 17*  --  28 28  GLUCOSE 169*  --  139* 126*  BUN 25*  --  22 14  CREATININE 1.72* 1.47* 1.37* 1.13  CALCIUM 8.5*  --  8.4* 8.3*  MG  --   --  1.8 2.0   GFR: Estimated Creatinine Clearance: 68.5 mL/min (by C-G formula based on SCr of 1.13 mg/dL). Liver Function Tests: Recent Labs  Lab 03/25/19 1318  AST 25  ALT 18  ALKPHOS 74  BILITOT 1.1  PROT 6.1*  ALBUMIN 3.0*   Recent Labs  Lab 03/25/19 1318  LIPASE 15   No results for input(s): AMMONIA in the last 168 hours. Coagulation Profile: No results for input(s): INR, PROTIME in the last 168 hours. Cardiac Enzymes: No results for input(s): CKTOTAL, CKMB, CKMBINDEX, TROPONINI in the last 168 hours. BNP (last 3 results) No results for input(s): PROBNP in the last 8760 hours. HbA1C: No results for input(s): HGBA1C in the last 72 hours. CBG: Recent Labs  Lab 03/26/19 1210 03/26/19 2040 03/27/19 0051  03/27/19 0423 03/27/19 0807  GLUCAP 140* 115* 104* 110* 143*   Lipid Profile: No results for input(s): CHOL, HDL, LDLCALC, TRIG, CHOLHDL, LDLDIRECT in the last 72 hours. Thyroid Function Tests: Recent Labs    03/26/19 0418  TSH 0.704   Anemia Panel: No results for input(s): VITAMINB12, FOLATE, FERRITIN, TIBC, IRON, RETICCTPCT in the last 72 hours. Sepsis Labs: Recent Labs  Lab 03/25/19 1800 03/25/19 2213  LATICACIDVEN 1.1 1.1    Recent Results (from the past 240 hour(s))  Urine culture     Status: None   Collection Time: 03/25/19  6:10 PM   Specimen: Urine, Random  Result Value Ref Range Status   Specimen Description URINE, RANDOM  Final   Special Requests NONE  Final  Culture   Final    NO GROWTH Performed at [facility], [address] [place], [place] [address]    Report Status 03/26/2019 FINAL  Final  SARS CORONAVIRUS 2 (TAT 6-24 HRS) Nasopharyngeal Nasopharyngeal Swab     Status: None   Collection Time: 03/25/19 10:14 PM   Specimen: Nasopharyngeal Swab  Result Value Ref Range Status   SARS Coronavirus 2 NEGATIVE NEGATIVE Final    Comment: (NOTE) SARS-CoV-2 target nucleic acids are NOT DETECTED. The SARS-CoV-2 RNA is generally detectable in upper and lower respiratory specimens during the acute phase of infection. Negative results do not preclude SARS-CoV-2 infection, do not rule out co-infections with other pathogens, and should not be used as the sole basis for treatment or other patient management decisions. Negative results must be combined with clinical observations, patient history, and epidemiological information. The expected result is Negative. Fact Sheet for Patients: HairSlick.no Fact Sheet for Healthcare Providers: quierodirigir.com This test is not yet approved or cleared by the [place] FDA and  has been authorized for detection and/or diagnosis of SARS-CoV-2 by FDA under an  Emergency Use Authorization (EUA). This EUA will remain  in effect (meaning this test can be used) for the duration of the COVID-19 declaration under Section 56 4(b)(1) of the Act, 21 U.S.C. section 360bbb-3(b)(1), unless the authorization is terminated or revoked sooner. Performed at [facility], [address] [place], [place] [address]          Radiology Studies: CT ABDOMEN PELVIS WO CONTRAST  Result Date: 03/25/2019 CLINICAL DATA:  Acute abdominal pain. Back surgery on 03/19/2019. Persistent back pain. EXAM: CT ABDOMEN AND PELVIS WITHOUT CONTRAST TECHNIQUE: Multidetector CT imaging of the abdomen and pelvis was performed following the standard protocol without IV contrast. COMPARISON:  None. FINDINGS: Lower chest: No acute findings. There is subcutaneous air along right posterolateral lower chest wall. Hepatobiliary: Liver normal in size. There are several subcentimeter low-density liver lesions not fully characterized on this unenhanced study, but likely cysts. No other liver masses or lesions. Gallbladder is distended. No stones or wall thickening. No bile duct dilation. Pancreas: Unremarkable. No pancreatic ductal dilatation or surrounding inflammatory changes. Spleen: Normal in size without focal abnormality. Adrenals/Urinary Tract: No adrenal masses. Kidneys normal in size, orientation and position. Low-density mass arises from the mid to upper pole the right kidney, 2.2 cm, consistent a cyst. No other renal masses, no stones and no hydronephrosis. Normal ureters. Normal bladder. Stomach/Bowel: Colon is mildly distended, maximum diameter of 7.6 cm. There are colonic air-fluid levels. No colonic wall thickening or adjacent inflammation. No evidence of obstruction. Small bowel is normal in caliber. No small bowel wall thickening inflammation. Small hiatal hernia. Stomach otherwise unremarkable. No evidence of appendicitis. Vascular/Lymphatic: Aortic atherosclerosis. No aneurysm. No  enlarged lymph nodes. Reproductive: Enlarged prostate, 5.7 x 4.5 x 4.7 cm. Other: Soft tissue air extends the right psoas muscle, into the superior right retroperitoneum adjacent to the posteromedial right hemidiaphragm and right adrenal gland. Soft tissue air also is seen posterolaterally in the pelvis, posterior to the right colon and extending adjacent to the iliacus muscle. Stranding is seen in the fat adjacent to the right psoas muscle in its upper extent. Small amount subcutaneous air along right posterolateral abdominal wall extending to the chest. No formed fluid collection is seen indicate an abscess. No air is visualized within the spinal canal. No air is seen along posterior paraspinal soft tissues. No free intraperitoneal air.  No ascites. Musculoskeletal:  Well-positioned pedicle screws are noted from L1 through L4. There are metal intervertebral cage is well centered at the L1-L2, L2-L3 and L3-L4 levels. Intervertebral bone graft material noted at L4-L5 and L5-S1. No fracture. There is no bone resorption to suggest osteomyelitis. No evidence of loosening of the orthopedic hardware. In the posterior paraspinal soft tissues is no visualized fluid collection to suggest an abscess. IMPRESSION: 1. There is soft tissue air extends along mid to upper right psoas muscle, into the superior right retroperitoneum, in the extraperitoneal soft tissues of the right pelvis adjacent to the iliacus muscle, and along the posterolateral abdominal wall extending to the chest. The source of this area is not defined. There is some inflammation adjacent to the superior right psoas muscle. No convincing abscess. 2. Colon is mildly distended with air-fluid levels. This is consistent a mild colonic adynamic ileus. 3. No other acute abnormality within the abdomen or pelvis. Electronically Signed   By: Amie Portlandavid  Ormond M.D.   On: 03/25/2019 18:39   DG Abd 1 View  Result Date: 03/26/2019 CLINICAL DATA:  Persistent back pain after  surgery.  Abdominal pain. EXAM: ABDOMEN - 1 VIEW COMPARISON:  CT 03/25/2019. FINDINGS: Distended loops of small and large bowel noted suggesting adynamic ileus. Hemidiaphragms not imaged. No pathologic intra-abdominal calcifications. Postsurgical changes lumbar spine with hardware intact. Degenerative changes lumbar spine and both hips. No acute bony abnormality. IMPRESSION: 1. Distended loops of small and large bowel noted suggesting adynamic ileus. Follow-up exam to demonstrate resolution suggested. 2. Postsurgical changes lumbar spine. Hardware intact. Degenerative changes lumbar spine and both hips. No acute bony abnormality identified. Electronically Signed   By: Maisie Fushomas  Register   On: 03/26/2019 06:39   DG Abd Portable 1V  Result Date: 03/27/2019 CLINICAL DATA:  Ileus EXAM: PORTABLE ABDOMEN - 1 VIEW COMPARISON:  Yesterday FINDINGS: Continued ileus pattern with unchanged degree of gaseous distension colon diffusely. No concerning mass effect or gas collection. Extensive spinal fusion. Left hip arthroplasty. IMPRESSION: Continued colonic ileus pattern Electronically Signed   By: Marnee SpringJonathon  Watts M.D.   On: 03/27/2019 08:49        Scheduled Meds: . atorvastatin  20 mg Oral QHS  . carvedilol  3.125 mg Oral Q breakfast  . clopidogrel  75 mg Oral Daily  . DULoxetine  60 mg Oral Daily  . heparin  5,000 Units Subcutaneous Q8H  . insulin aspart  0-9 Units Subcutaneous Q4H   Continuous Infusions: . sodium chloride 250 mL (03/27/19 0800)  . potassium chloride 10 mEq (03/27/19 0911)     LOS: 2 days     Alwyn RenElizabeth G Eriko Economos, MD [facility]  If 7PM-7AM, please contact night-coverage [email or web address] Password [email or web address] 03/27/2019, 9:34 AM

## 2019-03-27 NOTE — Progress Notes (Signed)
Patient resting comfortably during shift report. Requests next dose of pain medication when possible. Will return with dose after completion of report.

## 2019-03-28 ENCOUNTER — Inpatient Hospital Stay (HOSPITAL_COMMUNITY): Payer: Medicare Other

## 2019-03-28 LAB — GLUCOSE, CAPILLARY
Glucose-Capillary: 139 mg/dL — ABNORMAL HIGH (ref 70–99)
Glucose-Capillary: 154 mg/dL — ABNORMAL HIGH (ref 70–99)
Glucose-Capillary: 119 mg/dL — ABNORMAL HIGH (ref 70–99)
Glucose-Capillary: 140 mg/dL — ABNORMAL HIGH (ref 70–99)

## 2019-03-28 LAB — COMPREHENSIVE METABOLIC PANEL
ALT: 17 U/L (ref 0–44)
AST: 20 U/L (ref 15–41)
Albumin: 2.8 g/dL — ABNORMAL LOW (ref 3.5–5.0)
Alkaline Phosphatase: 90 U/L (ref 38–126)
Anion gap: 10 (ref 5–15)
BUN: 17 mg/dL (ref 8–23)
CO2: 28 mmol/L (ref 22–32)
Calcium: 8.5 mg/dL — ABNORMAL LOW (ref 8.9–10.3)
Chloride: 98 mmol/L (ref 98–111)
Creatinine, Ser: 1.33 mg/dL — ABNORMAL HIGH (ref 0.61–1.24)
GFR calc Af Amer: >60 mL/min (ref >60)
GFR calc non Af Amer: 52 mL/min — ABNORMAL LOW (ref >60)
Glucose, Bld: 146 mg/dL — ABNORMAL HIGH (ref 70–99)
Potassium: 3.3 mmol/L — ABNORMAL LOW (ref 3.5–5.1)
Sodium: 136 mmol/L (ref 135–145)
Total Bilirubin: 0.9 mg/dL (ref 0.3–1.2)
Total Protein: 5.9 g/dL — ABNORMAL LOW (ref 6.5–8.1)

## 2019-03-28 LAB — MAGNESIUM: Magnesium: 2 mg/dL (ref 1.7–2.4)

## 2019-03-28 MED ORDER — POTASSIUM CHLORIDE CRYS ER 20 MEQ PO TBCR
40.0000 meq | EXTENDED_RELEASE_TABLET | Freq: Once | ORAL | Status: AC
  Administered 2019-03-28: 40 meq via ORAL
  Filled 2019-03-28: qty 2

## 2019-03-28 MED ORDER — SODIUM CHLORIDE 0.9 % IV SOLN: INTRAVENOUS | Status: DC

## 2019-03-28 NOTE — Progress Notes (Signed)
Patient reports continued inability to find comfortable position but relates it to the recent lumbar fusion, not abdominal discomfort.

## 2019-03-28 NOTE — Progress Notes (Signed)
  NEUROSURGERY PROGRESS NOTE   No issues overnight.  Complains of fairly moderate back pain, 7/10. Improved from yesterday with toradol No new motor/sensory deficits Having small BMs. Tolerating clear diet.  EXAM:  BP 139/81 (BP Location: Right Arm)   Pulse 88   Temp 98.3 F (36.8 C) (Oral)   Resp 18   Ht 6\' 3"  (1.905 m)   Wt 98.7 kg   SpO2 97%   BMI 27.19 kg/m   Awake, alert, oriented  Speech fluent, appropriate  CN grossly intact  MAEW, stable motor  PLAN Stable neurologically No new NS recs Continue care per primary team

## 2019-03-28 NOTE — Progress Notes (Signed)
PROGRESS NOTE    Marcus Carlson  RJJ:[id number] DOB: 27-Sep-1945 DOA: 03/25/2019 PCP: Isabella Bowens, MD    Brief Narrative:  73 year old male with prior h/o DM type 2, hypertension, CAD, presents to ED for generalized abdominal pain,and distention. He was admitted for evaluation of adynamic ileus.   Assessment & Plan:   Principal Problem:   Adynamic ileus (HCC) Active Problems:   AKI (acute kidney injury) (HCC)   Hypokalemia   Adynamic ileus s/p recent lumbar fusion in March 19, 2019. Improving.  Abdominal x-ray this morning shows improving ileus. Replete potassium to keep it greater than 4. Advance diet from clear liquids to full liquid diet. Recommend to ambulate after every meal  Patient reports a small bowel movement earlier this morning.  Patient also reports that he is able to pass flatus.  He denies any abdominal pain and his abdominal distention is improving.   History of recent lumbar fusion surgery on 03/19/2019 by Dr. Venetia Maxon: Appreciate follow up .    AKI:  Improving. With hydration.  Holding lisinopril and hydrochlorothiazide for AKI.  Baseline creatinine at 1.   Type 2 DM:  CBG (last 3)  Recent Labs    03/27/19 2144 03/28/19 0732 03/28/19 1130  GLUCAP 128* 139* 154*   Resume SSI.    Hyperlipidemia:  Resume lipitor.    Hypertension Well-controlled blood pressure parameters Continue with Coreg at 3.125 mg twice daily.  DVT prophylaxis:   Code Status: Full code Family Communication:  Disposition Plan:   Consultants:   Neuro surgery   Procedures: none    Antimicrobials: none    Subjective: No chest pain or sob, no nausea or vomiting  Objective: Vitals:   03/27/19 1603 03/27/19 1954 03/28/19 0457 03/28/19 0925  BP: 136/77 129/71 139/81 125/79  Pulse: 79 79 88 84  Resp: 20 18 18    Temp: 98 F (36.7 C) 98.7 F (37.1 C) 98.3 F (36.8 C)   TempSrc: Oral Oral Oral   SpO2: 96% 91% 97%   Weight:      Height:         Intake/Output Summary (Last 24 hours) at 03/28/2019 1248 Last data filed at 03/28/2019 0944 Gross per 24 hour  Intake 960 ml  Output --  Net 960 ml   Filed Weights   03/25/19 2018 03/26/19 1707 03/27/19 0433  Weight: 99.8 kg 99.2 kg 98.7 kg    Examination:  General exam: Appears calm and comfortable  Respiratory system: Clear to auscultation. Respiratory effort normal. Cardiovascular system: S1 & S2 heard, RRR. No JVD,No pedal edema. Gastrointestinal system: Abdomen is soft, mildly distended bowel sounds hyperactive Central nervous system: Alert and oriented. No focal neurological deficits. Extremities: Symmetric 5 x 5 power. Skin: No rashes, lesions or ulcers Psychiatry:  Mood & affect appropriate.     Data Reviewed: I have personally reviewed following labs and imaging studies  CBC: Recent Labs  Lab 03/25/19 1318 03/26/19 0116 03/26/19 0418  WBC 9.8 9.5 7.9  HGB 11.1* 11.1* 10.6*  HCT 34.3* 34.5* 33.4*  MCV 90.7 90.8 91.8  PLT 421* 411* 410*   Basic Metabolic Panel: Recent Labs  Lab 03/25/19 1318 03/26/19 0116 03/26/19 0418 03/27/19 0348 03/28/19 0500  NA 133*  --  138 137 136  K 3.1*  --  3.2* 3.4* 3.3*  CL 92*  --  97* 98 98  CO2 17*  --  28 28 28   GLUCOSE 169*  --  139* 126* 146*  BUN 25*  --  22 14 17   CREATININE 1.72* 1.47* 1.37* 1.13 1.33*  CALCIUM 8.5*  --  8.4* 8.3* 8.5*  MG  --   --  1.8 2.0 2.0   GFR: Estimated Creatinine Clearance: 58.2 mL/min (A) (by C-G formula based on SCr of 1.33 mg/dL (H)). Liver Function Tests: Recent Labs  Lab 03/25/19 1318 03/28/19 0500  AST 25 20  ALT 18 17  ALKPHOS 74 90  BILITOT 1.1 0.9  PROT 6.1* 5.9*  ALBUMIN 3.0* 2.8*   Recent Labs  Lab 03/25/19 1318  LIPASE 15   No results for input(s): AMMONIA in the last 168 hours. Coagulation Profile: No results for input(s): INR, PROTIME in the last 168 hours. Cardiac Enzymes: No results for input(s): CKTOTAL, CKMB, CKMBINDEX, TROPONINI in the last  168 hours. BNP (last 3 results) No results for input(s): PROBNP in the last 8760 hours. HbA1C: No results for input(s): HGBA1C in the last 72 hours. CBG: Recent Labs  Lab 03/27/19 1202 03/27/19 1601 03/27/19 2144 03/28/19 0732 03/28/19 1130  GLUCAP 178* 110* 128* 139* 154*   Lipid Profile: No results for input(s): CHOL, HDL, LDLCALC, TRIG, CHOLHDL, LDLDIRECT in the last 72 hours. Thyroid Function Tests: Recent Labs    03/26/19 0418  TSH 0.704   Anemia Panel: No results for input(s): VITAMINB12, FOLATE, FERRITIN, TIBC, IRON, RETICCTPCT in the last 72 hours. Sepsis Labs: Recent Labs  Lab 03/25/19 1800 03/25/19 2213  LATICACIDVEN 1.1 1.1    Recent Results (from the past 240 hour(s))  Urine culture     Status: None   Collection Time: 03/25/19  6:10 PM   Specimen: Urine, Random  Result Value Ref Range Status   Specimen Description URINE, RANDOM  Final   Special Requests NONE  Final   Culture   Final    NO GROWTH Performed at [facility], [address] [place], [place] [address]    Report Status 03/26/2019 FINAL  Final  SARS CORONAVIRUS 2 (TAT 6-24 HRS) Nasopharyngeal Nasopharyngeal Swab     Status: None   Collection Time: 03/25/19 10:14 PM   Specimen: Nasopharyngeal Swab  Result Value Ref Range Status   SARS Coronavirus 2 NEGATIVE NEGATIVE Final    Comment: (NOTE) SARS-CoV-2 target nucleic acids are NOT DETECTED. The SARS-CoV-2 RNA is generally detectable in upper and lower respiratory specimens during the acute phase of infection. Negative results do not preclude SARS-CoV-2 infection, do not rule out co-infections with other pathogens, and should not be used as the sole basis for treatment or other patient management decisions. Negative results must be combined with clinical observations, patient history, and epidemiological information. The expected result is Negative. Fact Sheet for Patients: HairSlick.nohttps://www.fda.gov/media/138098/download Fact Sheet for  Healthcare Providers: quierodirigir.comhttps://www.fda.gov/media/138095/download This test is not yet approved or cleared by the [place] FDA and  has been authorized for detection and/or diagnosis of SARS-CoV-2 by FDA under an Emergency Use Authorization (EUA). This EUA will remain  in effect (meaning this test can be used) for the duration of the COVID-19 declaration under Section 56 4(b)(1) of the Act, 21 U.S.C. section 360bbb-3(b)(1), unless the authorization is terminated or revoked sooner. Performed at [facility], [address] [place], [place] [address]          Radiology Studies: DG Abd Portable 1V  Result Date: 03/28/2019 CLINICAL DATA:  Ileus EXAM: PORTABLE ABDOMEN - 1 VIEW COMPARISON:  Yesterday FINDINGS: Stable or mildly improved gaseous distension of colon. No concerning mass effect or gas collection. 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 Extensive lumbar spine fusion. IMPRESSION: Stable or mildly improved colonic distension. Electronically Signed   By: Monte Fantasia M.D.   On: 03/28/2019 09:15   DG Abd Portable 1V  Result Date: 03/27/2019 CLINICAL DATA:  Ileus EXAM: PORTABLE ABDOMEN - 1 VIEW COMPARISON:  Yesterday FINDINGS: Continued ileus pattern with unchanged degree of gaseous distension colon diffusely. No concerning mass effect or gas collection. Extensive spinal fusion. Left hip arthroplasty. IMPRESSION: Continued colonic ileus pattern Electronically Signed   By: Monte Fantasia M.D.   On: 03/27/2019 08:49        Scheduled Meds: . atorvastatin  20 mg Oral QHS  . carvedilol  3.125 mg Oral Q breakfast  . clopidogrel  75 mg Oral Daily  . DULoxetine  60 mg Oral Daily  . heparin  5,000 Units Subcutaneous Q8H  . insulin aspart  0-15 Units Subcutaneous TID WC  . ketorolac  15 mg Intravenous Q6H   Continuous Infusions: . sodium chloride Stopped (03/27/19 1300)  . sodium chloride 75 mL/hr at 03/28/19 0932     LOS: 3 days        Hosie Poisson, MD [facility] 03/28/2019, 12:48 PM

## 2019-03-29 LAB — GLUCOSE, CAPILLARY
Glucose-Capillary: 138 mg/dL — ABNORMAL HIGH (ref 70–99)
Glucose-Capillary: 147 mg/dL — ABNORMAL HIGH (ref 70–99)
Glucose-Capillary: 114 mg/dL — ABNORMAL HIGH (ref 70–99)
Glucose-Capillary: 178 mg/dL — ABNORMAL HIGH (ref 70–99)

## 2019-03-29 LAB — BASIC METABOLIC PANEL
Anion gap: 9 (ref 5–15)
BUN: 13 mg/dL (ref 8–23)
CO2: 25 mmol/L (ref 22–32)
Calcium: 8 mg/dL — ABNORMAL LOW (ref 8.9–10.3)
Chloride: 104 mmol/L (ref 98–111)
Creatinine, Ser: 1.27 mg/dL — ABNORMAL HIGH (ref 0.61–1.24)
GFR calc Af Amer: >60 mL/min (ref >60)
GFR calc non Af Amer: 55 mL/min — ABNORMAL LOW (ref >60)
Glucose, Bld: 177 mg/dL — ABNORMAL HIGH (ref 70–99)
Potassium: 3.4 mmol/L — ABNORMAL LOW (ref 3.5–5.1)
Sodium: 138 mmol/L (ref 135–145)

## 2019-03-29 MED ORDER — OXYCODONE-ACETAMINOPHEN 5-325 MG PO TABS: 1.0000 | ORAL_TABLET | ORAL | Status: DC | PRN

## 2019-03-29 MED ORDER — SENNOSIDES-DOCUSATE SODIUM 8.6-50 MG PO TABS
2.0000 | ORAL_TABLET | Freq: Two times a day (BID) | ORAL | Status: DC
  Administered 2019-03-29 – 2019-03-30 (×3): 2 via ORAL
  Filled 2019-03-29 (×3): qty 2

## 2019-03-29 MED ORDER — TRAMADOL HCL 50 MG PO TABS: 100.0000 mg | ORAL_TABLET | Freq: Four times a day (QID) | ORAL | Status: DC | PRN

## 2019-03-29 MED ORDER — ENSURE ENLIVE PO LIQD
237.0000 mL | Freq: Two times a day (BID) | ORAL | Status: DC
  Administered 2019-03-29 – 2019-03-30 (×3): 237 mL via ORAL

## 2019-03-29 MED ORDER — TRAMADOL HCL 50 MG PO TABS: 50.0000 mg | ORAL_TABLET | ORAL | Status: DC | PRN

## 2019-03-29 MED ORDER — POTASSIUM CHLORIDE CRYS ER 20 MEQ PO TBCR
40.0000 meq | EXTENDED_RELEASE_TABLET | Freq: Two times a day (BID) | ORAL | Status: DC
  Administered 2019-03-29 – 2019-03-30 (×3): 40 meq via ORAL
  Filled 2019-03-29 (×3): qty 2

## 2019-03-29 MED ORDER — OXYCODONE-ACETAMINOPHEN 5-325 MG PO TABS
1.0000 | ORAL_TABLET | ORAL | Status: DC | PRN
  Administered 2019-03-29 – 2019-03-30 (×5): 1 via ORAL
  Filled 2019-03-29 (×5): qty 1

## 2019-03-29 MED ORDER — TRAMADOL HCL 50 MG PO TABS
100.0000 mg | ORAL_TABLET | Freq: Four times a day (QID) | ORAL | Status: DC | PRN
  Administered 2019-03-29: 100 mg via ORAL
  Filled 2019-03-29: qty 2

## 2019-03-29 NOTE — Progress Notes (Signed)
Subjective: Patient reports "I had a rough morning, not from the distention, but from the surgery. I just couldn't get comfortable"  Objective: Vital signs in last 24 hours: Temp:  [98.1 F (36.7 C)-98.6 F (37 C)] 98.2 F (36.8 C) (12/21 0639) Pulse Rate:  [74-84] 77 (12/21 0639) Resp:  [19-20] 19 (12/21 0639) BP: (124-129)/(73-87) 124/87 (12/21 0639) SpO2:  [95 %-97 %] 95 % (12/21 0639)  Intake/Output from previous day: 12/20 0701 - 12/21 0700 In: 1769.6 [P.O.:1320; I.V.:449.6] Out: 0  Intake/Output this shift: No intake/output data recorded.  Sitting on edge of bed eating breakfast. Reports improving bowel movements "but not normal". Belly soft this am, nontender. INcisions right side and lumbar healing nicely without erythema, swelling, or drainage. Good strength BLE.   Lab Results: No results for input(s): WBC, HGB, HCT, PLT in the last 72 hours. BMET Recent Labs    03/27/19 0348 03/28/19 0500  NA 137 136  K 3.4* 3.3*  CL 98 98  CO2 28 28  GLUCOSE 126* 146*  BUN 14 17  CREATININE 1.13 1.33*  CALCIUM 8.3* 8.5*    Studies/Results: DG Abd Portable 1V  Result Date: 03/28/2019 CLINICAL DATA:  Ileus EXAM: PORTABLE ABDOMEN - 1 VIEW COMPARISON:  Yesterday FINDINGS: Stable or mildly improved gaseous distension of colon. No concerning mass effect or gas collection. 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 Extensive lumbar spine fusion. IMPRESSION: Stable or mildly improved colonic distension. Electronically Signed   By: Monte Fantasia M.D.   On: 03/28/2019 09:15    Assessment/Plan: improving  LOS: 4 days  Continue care per medical service. Appreciate their care of this kind gentleman. Office f/u with Dr. Vertell Limber in 3 weeks.    Verdis Prime 03/29/2019, 7:53 AM

## 2019-03-29 NOTE — Progress Notes (Signed)
Physical Therapy Treatment Patient Details Name: Marcus Carlson MRN: [id number] DOB: 1945-05-15 Today's Date: 03/29/2019    History of Present Illness 73 y.o. male with history of diabetes mellitus type 2, hypertension, CAD presents to the ER 6 days after lumbar fusion surgery with complaints of abdominal pain. CT scan showing Adynamic ileus.    PT Comments    Pt was able to increase gait distance without rest break.  Needed cues for posture.  Pt able to recall back precautions and donned/doffed brace in sitting.  Cont POC.    Follow Up Recommendations  No PT follow up     Equipment Recommendations  None recommended by PT    Recommendations for Other Services       Precautions / Restrictions Precautions Precautions: Back Precaution Comments: reviewed precautions, pt verbalizes knowledge of them Required Braces or Orthoses: Spinal Brace Spinal Brace: Applied in sitting position    Mobility  Bed Mobility               General bed mobility comments: in chair  Transfers Overall transfer level: Needs assistance Equipment used: Rolling walker (2 wheeled) Transfers: Sit to/from Stand Sit to Stand: Supervision         General transfer comment: cued for hand placement  Ambulation/Gait Ambulation/Gait assistance: Supervision Gait Distance (Feet): 120 Feet Assistive device: IV Pole Gait Pattern/deviations: Step-through pattern;Decreased stride length;Wide base of support     General Gait Details: no rest breaks needed today; cued for posture   Stairs             Wheelchair Mobility    Modified Rankin (Stroke Patients Only)       Balance Overall balance assessment: Needs assistance Sitting-balance support: No upper extremity supported;Feet supported Sitting balance-Leahy Scale: Good Sitting balance - Comments: Able to donn brace wihtout difficulty.   Standing balance support: Single extremity supported;During functional activity Standing  balance-Leahy Scale: Good                              Cognition Arousal/Alertness: Awake/alert Behavior During Therapy: WFL for tasks assessed/performed Overall Cognitive Status: Within Functional Limits for tasks assessed                                        Exercises      General Comments        Pertinent Vitals/Pain Pain Score: 7  Pain Location: back Pain Descriptors / Indicators: Aching Pain Intervention(s): Limited activity within patient's tolerance;RN gave pain meds during session    Home Living                      Prior Function            PT Goals (current goals can now be found in the care plan section) Progress towards PT goals: Progressing toward goals    Frequency    Min 5X/week      PT Plan Current plan remains appropriate    Co-evaluation              AM-PAC PT "6 Clicks" Mobility   Outcome Measure  Help needed turning from your back to your side while in a flat bed without using bedrails?: None Help needed moving from lying on your back to sitting on the side of a flat bed without using  bedrails?: None Help needed moving to and from a bed to a chair (including a wheelchair)?: None Help needed standing up from a chair using your arms (e.g., wheelchair or bedside chair)?: None Help needed to walk in hospital room?: None Help needed climbing 3-5 steps with a railing? : A Little 6 Click Score: 23    End of Session Equipment Utilized During Treatment: Back brace;Gait belt Activity Tolerance: Patient tolerated treatment well Patient left: in chair;with call bell/phone within reach Nurse Communication: Mobility status PT Visit Diagnosis: Other abnormalities of gait and mobility (R26.89);Pain     Time: 1530-1540 PT Time Calculation (min) (ACUTE ONLY): 10 min  Charges:  $Gait Training: 8-22 mins                     Maggie Font, PT Acute Rehab Services Pager [phone] [facility]  [phone] [facility] [place] 03/29/2019, 4:23 PM

## 2019-03-29 NOTE — Care Management Important Message (Signed)
Important Message  Patient Details  Name: Marcus Carlson MRN: [id number] Date of Birth: [birth year]   Medicare Important Message Given:  Yes     Memory Argue 03/29/2019, 3:53 PM

## 2019-03-29 NOTE — Progress Notes (Signed)
PROGRESS NOTE    Carlye GrippeJames R Baize  ZOX:[id number] DOB: 1945-04-12 DOA: 03/25/2019 PCP: Isabella BowensVasireddy, Venugopal Kiran, MD    Brief Narrative:  73 year old male with prior h/o DM type 2, hypertension, CAD, presents to ED for generalized abdominal pain,and distention. He was admitted for evaluation of adynamic ileus.   Assessment & Plan:   Principal Problem:   Adynamic ileus (HCC) Active Problems:   AKI (acute kidney injury) (HCC)   Hypokalemia   Adynamic ileus s/p recent lumbar fusion in March 19, 2019. Improving.  Abdominal x-ray this morning shows improving ileus. Replete potassium to keep it greater than 4. Potassium is around 3.4 today.  Advance diet from full liquid diet to soft diet.  Recommend to ambulate after every meal .  Three bowel movements earlier today, passing the flatus.  Monitor    History of recent lumbar fusion surgery on 03/19/2019 by Dr. Venetia MaxonStern: Appreciate follow up .  Pain control with oxycodone and tylenol.    AKI:  Improving. With hydration.  Holding lisinopril for AKI.  Recheck renal parameters tomorrow.    Type 2 DM:  CBG (last 3)  Recent Labs    03/28/19 2153 03/29/19 0637 03/29/19 1146  GLUCAP 140* 138* 147*   Resume SSI.  No changes in medications.   Hyperlipidemia:  Continue with Lipitor.   Hypertension Well-controlled blood pressure parameters Continue with Coreg at 3.125 mg twice daily.   CAD:  Pt denies any chest pain, .  Continue with plavix.   Hypokalemia  Replaced.   DVT prophylaxis:  Heparin.  Code Status: Full code Family Communication: none at bedside.  Disposition Plan: pending clinical improvement.   Consultants:   Neuro surgery   Procedures: none    Antimicrobials: none    Subjective: No nausea, vomiting or abdominal pain. Reports pain is not well controlled.   Objective: Vitals:   03/28/19 1616 03/28/19 2203 03/29/19 0639 03/29/19 1249  BP: 126/82 129/73 124/87 117/70  Pulse: 75 74 77 87   Resp: 20 20 19 18   Temp: 98.1 F (36.7 C) 98.6 F (37 C) 98.2 F (36.8 C) 98 F (36.7 C)  TempSrc: Oral Oral Oral   SpO2: 97% 97% 95% 95%  Weight:      Height:        Intake/Output Summary (Last 24 hours) at 03/29/2019 1504 Last data filed at 03/29/2019 0900 Gross per 24 hour  Intake 1080 ml  Output 0 ml  Net 1080 ml   Filed Weights   03/25/19 2018 03/26/19 1707 03/27/19 0433  Weight: 99.8 kg 99.2 kg 98.7 kg    Examination:  General exam: appears uncomfortable From back pain Respiratory system: clear to auscultation, no wheezing or rhonchi.  Cardiovascular system: S1-S2 heard, regular rate rhythm, no JVD, no pedal edema Gastrointestinal system: Abdomen is soft, nontender, bowel sounds are normal Central nervous system: Alert and oriented, grossly nonfocal Extremities: No pedal edema, cyanosis or clubbing Skin: No rash is evident Psychiatry: Mood is appropriate    Data Reviewed: I have personally reviewed following labs and imaging studies  CBC: Recent Labs  Lab 03/25/19 1318 03/26/19 0116 03/26/19 0418  WBC 9.8 9.5 7.9  HGB 11.1* 11.1* 10.6*  HCT 34.3* 34.5* 33.4*  MCV 90.7 90.8 91.8  PLT 421* 411* 410*   Basic Metabolic Panel: Recent Labs  Lab 03/25/19 1318 03/26/19 0116 03/26/19 0418 03/27/19 0348 03/28/19 0500 03/29/19 0824  NA 133*  --  138 137 136 138  K 3.1*  --  3.2* 3.4* 3.3* 3.4*  CL 92*  --  97* 98 98 104  CO2 17*  --  28 28 28 25   GLUCOSE 169*  --  139* 126* 146* 177*  BUN 25*  --  22 14 17 13   CREATININE 1.72* 1.47* 1.37* 1.13 1.33* 1.27*  CALCIUM 8.5*  --  8.4* 8.3* 8.5* 8.0*  MG  --   --  1.8 2.0 2.0  --    GFR: Estimated Creatinine Clearance: 61 mL/min (A) (by C-G formula based on SCr of 1.27 mg/dL (H)). Liver Function Tests: Recent Labs  Lab 03/25/19 1318 03/28/19 0500  AST 25 20  ALT 18 17  ALKPHOS 74 90  BILITOT 1.1 0.9  PROT 6.1* 5.9*  ALBUMIN 3.0* 2.8*   Recent Labs  Lab 03/25/19 1318  LIPASE 15   No  results for input(s): AMMONIA in the last 168 hours. Coagulation Profile: No results for input(s): INR, PROTIME in the last 168 hours. Cardiac Enzymes: No results for input(s): CKTOTAL, CKMB, CKMBINDEX, TROPONINI in the last 168 hours. BNP (last 3 results) No results for input(s): PROBNP in the last 8760 hours. HbA1C: No results for input(s): HGBA1C in the last 72 hours. CBG: Recent Labs  Lab 03/28/19 1130 03/28/19 1613 03/28/19 2153 03/29/19 0637 03/29/19 1146  GLUCAP 154* 119* 140* 138* 147*   Lipid Profile: No results for input(s): CHOL, HDL, LDLCALC, TRIG, CHOLHDL, LDLDIRECT in the last 72 hours. Thyroid Function Tests: No results for input(s): TSH, T4TOTAL, FREET4, T3FREE, THYROIDAB in the last 72 hours. Anemia Panel: No results for input(s): VITAMINB12, FOLATE, FERRITIN, TIBC, IRON, RETICCTPCT in the last 72 hours. Sepsis Labs: Recent Labs  Lab 03/25/19 1800 03/25/19 2213  LATICACIDVEN 1.1 1.1    Recent Results (from the past 240 hour(s))  Urine culture     Status: None   Collection Time: 03/25/19  6:10 PM   Specimen: Urine, Random  Result Value Ref Range Status   Specimen Description URINE, RANDOM  Final   Special Requests NONE  Final   Culture   Final    NO GROWTH Performed at [facility], [address] [place], [address] [place]    Report Status 03/26/2019 FINAL  Final  SARS CORONAVIRUS 2 (TAT 6-24 HRS) Nasopharyngeal Nasopharyngeal Swab     Status: None   Collection Time: 03/25/19 10:14 PM   Specimen: Nasopharyngeal Swab  Result Value Ref Range Status   SARS Coronavirus 2 NEGATIVE NEGATIVE Final    Comment: (NOTE) SARS-CoV-2 target nucleic acids are NOT DETECTED. The SARS-CoV-2 RNA is generally detectable in upper and lower respiratory specimens during the acute phase of infection. Negative results do not preclude SARS-CoV-2 infection, do not rule out co-infections with other pathogens, and should not be used as the sole basis for treatment  or other patient management decisions. Negative results must be combined with clinical observations, patient history, and epidemiological information. The expected result is Negative. Fact Sheet for Patients: 03/28/2019 Fact Sheet for Healthcare Providers: 03/27/19 This test is not yet approved or cleared by the [employer] FDA and  has been authorized for detection and/or diagnosis of SARS-CoV-2 by FDA under an Emergency Use Authorization (EUA). This EUA will remain  in effect (meaning this test can be used) for the duration of the COVID-19 declaration under Section 56 4(b)(1) of the Act, 21 U.S.C. section 360bbb-3(b)(1), unless the authorization is terminated or revoked sooner. Performed at [facility], [address], [place], [address] [place]  Radiology Studies: DG Abd Portable 1V  Result Date: 03/28/2019 CLINICAL DATA:  Ileus EXAM: PORTABLE ABDOMEN - 1 VIEW COMPARISON:  Yesterday FINDINGS: Stable or mildly improved gaseous distension of colon. No concerning mass effect or gas collection. 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 Extensive lumbar spine fusion. IMPRESSION: Stable or mildly improved colonic distension. Electronically Signed   By: Monte Fantasia M.D.   On: 03/28/2019 09:15        Scheduled Meds: . atorvastatin  20 mg Oral QHS  . carvedilol  3.125 mg Oral Q breakfast  . clopidogrel  75 mg Oral Daily  . DULoxetine  60 mg Oral Daily  . feeding supplement (ENSURE ENLIVE)  237 mL Oral BID BM  . heparin  5,000 Units Subcutaneous Q8H  . insulin aspart  0-15 Units Subcutaneous TID WC  . potassium chloride  40 mEq Oral BID   Continuous Infusions: . sodium chloride Stopped (03/27/19 1300)  . sodium chloride 75 mL/hr at 03/28/19 0932     LOS: 4 days        Hosie Poisson, MD [facility] 03/29/2019, 3:04 PM

## 2019-03-30 LAB — BASIC METABOLIC PANEL
Anion gap: 10 (ref 5–15)
BUN: 11 mg/dL (ref 8–23)
CO2: 26 mmol/L (ref 22–32)
Calcium: 8.4 mg/dL — ABNORMAL LOW (ref 8.9–10.3)
Chloride: 101 mmol/L (ref 98–111)
Creatinine, Ser: 1.22 mg/dL (ref 0.61–1.24)
GFR calc Af Amer: >60 mL/min (ref >60)
GFR calc non Af Amer: 58 mL/min — ABNORMAL LOW (ref >60)
Glucose, Bld: 154 mg/dL — ABNORMAL HIGH (ref 70–99)
Potassium: 3.8 mmol/L (ref 3.5–5.1)
Sodium: 137 mmol/L (ref 135–145)

## 2019-03-30 LAB — GLUCOSE, CAPILLARY: Glucose-Capillary: 147 mg/dL — ABNORMAL HIGH (ref 70–99)

## 2019-03-30 MED ORDER — SENNOSIDES-DOCUSATE SODIUM 8.6-50 MG PO TABS: 2.0000 | ORAL_TABLET | Freq: Two times a day (BID) | ORAL | Status: AC

## 2019-03-30 MED ORDER — OXYCODONE HCL 5 MG PO TABS: 5.0000 mg | ORAL_TABLET | Freq: Four times a day (QID) | ORAL | 0 refills | Status: AC | PRN

## 2019-03-30 MED ORDER — METHOCARBAMOL 750 MG PO TABS: 750.0000 mg | ORAL_TABLET | Freq: Four times a day (QID) | ORAL | 0 refills | Status: AC | PRN

## 2019-03-30 NOTE — Progress Notes (Signed)
Pt. Requesting pain medication. RN in patients room to give med after request. Pt. Stated that the medication was 20 minutes late earlier and that made RN late with the next dose that was due. RN explained to the patient that the medication is PRN, as needed, and she was assisting another patient at the time of his earlier request. RN explained  that she came as soon as she was available. Pt. Stated he is keeping record of the time his pain medication was given and that it is the RN's fault that it is late. Pt. Asked RN "Are you saying other patient's are more important than me"? RN explained to pt. That all pts received equal treatment. Pt. Stated he would be contacting administration.

## 2019-03-30 NOTE — Plan of Care (Signed)
  Problem: Activity: Goal: Risk for activity intolerance will decrease Outcome: Progressing   Problem: Safety: Goal: Ability to remain free from injury will improve Outcome: Progressing   

## 2019-03-31 NOTE — Discharge Summary (Signed)
Physician Discharge Summary  Marcus Carlson RUE:[id number] DOB: 1945-07-27 DOA: 03/25/2019  PCP: Isabella Bowens, MD  Admit date: 03/25/2019 Discharge date: 03/30/2019  Admitted From: Home.  Disposition:  Home.   Recommendations for Outpatient Follow-up:  1. Follow up with PCP in 1-2 weeks 2. Please obtain BMP/CBC in one week Please follow up with neuro surgery in one week.   Discharge Condition:stable.  CODE STATUS:full code.  Diet recommendation: Heart Healthy   Brief/Interim Summary: 73 year old male with prior h/o DM type 2, hypertension, CAD, presents to ED for generalized abdominal pain,and distention. He was admitted for evaluation of adynamic ileus.    Discharge Diagnoses:  Principal Problem:   Adynamic ileus (HCC) Active Problems:   AKI (acute kidney injury) (HCC)   Hypokalemia  Adynamic ileus s/p recent lumbar fusion in March 19, 2019. Improving.  Abdominal x-ray this morning shows improving ileus. Replete potassium to keep it greater than 4.  Advanced diet ad he is able to tolerate it without any issues.  Recommend to ambulate after every meal .  Having bowel movements and flatus.   History of recent lumbar fusion surgery on 03/19/2019 by Dr. Venetia Maxon: Appreciate follow up .  Pain control with oxycodone and tylenol.    AKI:  Creatinine appears to be back to baseline, resume home meds on discharge.    Type 2 DM:  Resume metformin on discharge.   Hyperlipidemia:  Continue with Lipitor.   Hypertension Well-controlled blood pressure parameters Continue with Coreg at 3.125 mg twice daily.   CAD:  Pt denies any chest pain, .  Continue with plavix.   Hypokalemia  Replaced.    Discharge Instructions  Discharge Instructions    Diet - low sodium heart healthy   Complete by: As directed    Discharge instructions   Complete by: As directed    Please follow up with PCP in one week.     Allergies as of 03/30/2019       Reactions   Ivp Dye [iodinated Diagnostic Agents] Nausea And Vomiting      Medication List    TAKE these medications   atorvastatin 40 MG tablet Commonly known as: LIPITOR Take 20 mg by mouth at bedtime.   carvedilol 3.125 MG tablet Commonly known as: COREG Take 3.125 mg by mouth 2 (two) times daily.   cholecalciferol 25 MCG (1000 UT) tablet Commonly known as: VITAMIN D3 Take 1,000 Units by mouth daily.   clopidogrel 75 MG tablet Commonly known as: PLAVIX Take 75 mg by mouth daily.   desonide 0.05 % lotion Commonly known as: DESOWEN Apply 1 application topically 2 (two) times daily as needed (rash on face).   DULoxetine 60 MG capsule Commonly known as: CYMBALTA Take 60 mg by mouth daily.   glimepiride 2 MG tablet Commonly known as: AMARYL Take 2 mg by mouth daily.   ipratropium 0.06 % nasal spray Commonly known as: ATROVENT Place 2 sprays into both nostrils 2 (two) times daily.   lisinopril-hydrochlorothiazide 20-12.5 MG tablet Commonly known as: ZESTORETIC Take 1 tablet by mouth daily.   metFORMIN 1000 MG tablet Commonly known as: GLUCOPHAGE Take 1,000 mg by mouth 2 (two) times daily with a meal.   methocarbamol 750 MG tablet Commonly known as: ROBAXIN Take 1 tablet (750 mg total) by mouth every 6 (six) hours as needed for muscle spasms.   oxyCODONE 5 MG immediate release tablet Commonly known as: Oxy IR/ROXICODONE Take 1-2 tablets (5-10 mg total) by mouth every 6 (  six) hours as needed for up to 5 days (pain). What changed: when to take this   pantoprazole 40 MG tablet Commonly known as: PROTONIX Take 40 mg by mouth daily.   senna-docusate 8.6-50 MG tablet Commonly known as: Senokot-S Take 2 tablets by mouth 2 (two) times daily.   vitamin C 250 MG tablet Commonly known as: ASCORBIC ACID Take 500 mg by mouth daily.      Follow-up Information    Vasireddy, Lanetta Inch, MD.   Specialty: Internal Medicine Why: Please follow up in a  week Contact information: [address] [place] [address] [phone]          Allergies  Allergen Reactions  . Ivp Dye [Iodinated Diagnostic Agents] Nausea And Vomiting    Consultations:  Neuro surgery.    Procedures/Studies: CT ABDOMEN PELVIS WO CONTRAST  Result Date: 03/25/2019 CLINICAL DATA:  Acute abdominal pain. Back surgery on 03/19/2019. Persistent back pain. EXAM: CT ABDOMEN AND PELVIS WITHOUT CONTRAST TECHNIQUE: Multidetector CT imaging of the abdomen and pelvis was performed following the standard protocol without IV contrast. COMPARISON:  None. FINDINGS: Lower chest: No acute findings. There is subcutaneous air along right posterolateral lower chest wall. Hepatobiliary: Liver normal in size. There are several subcentimeter low-density liver lesions not fully characterized on this unenhanced study, but likely cysts. No other liver masses or lesions. Gallbladder is distended. No stones or wall thickening. No bile duct dilation. Pancreas: Unremarkable. No pancreatic ductal dilatation or surrounding inflammatory changes. Spleen: Normal in size without focal abnormality. Adrenals/Urinary Tract: No adrenal masses. Kidneys normal in size, orientation and position. Low-density mass arises from the mid to upper pole the right kidney, 2.2 cm, consistent a cyst. No other renal masses, no stones and no hydronephrosis. Normal ureters. Normal bladder. Stomach/Bowel: Colon is mildly distended, maximum diameter of 7.6 cm. There are colonic air-fluid levels. No colonic wall thickening or adjacent inflammation. No evidence of obstruction. Small bowel is normal in caliber. No small bowel wall thickening inflammation. Small hiatal hernia. Stomach otherwise unremarkable. No evidence of appendicitis. Vascular/Lymphatic: Aortic atherosclerosis. No aneurysm. No enlarged lymph nodes. Reproductive: Enlarged prostate, 5.7 x 4.5 x 4.7 cm. Other: Soft tissue air extends the right psoas muscle, into  the superior right retroperitoneum adjacent to the posteromedial right hemidiaphragm and right adrenal gland. Soft tissue air also is seen posterolaterally in the pelvis, posterior to the right colon and extending adjacent to the iliacus muscle. Stranding is seen in the fat adjacent to the right psoas muscle in its upper extent. Small amount subcutaneous air along right posterolateral abdominal wall extending to the chest. No formed fluid collection is seen indicate an abscess. No air is visualized within the spinal canal. No air is seen along posterior paraspinal soft tissues. No free intraperitoneal air.  No ascites. Musculoskeletal: Well-positioned pedicle screws are noted from L1 through L4. There are metal intervertebral cage is well centered at the L1-L2, L2-L3 and L3-L4 levels. Intervertebral bone graft material noted at L4-L5 and L5-S1. No fracture. There is no bone resorption to suggest osteomyelitis. No evidence of loosening of the orthopedic hardware. In the posterior paraspinal soft tissues is no visualized fluid collection to suggest an abscess. IMPRESSION: 1. There is soft tissue air extends along mid to upper right psoas muscle, into the superior right retroperitoneum, in the extraperitoneal soft tissues of the right pelvis adjacent to the iliacus muscle, and along the posterolateral abdominal wall extending to the chest. The source of this area is not defined. There is  some inflammation adjacent to the superior right psoas muscle. No convincing abscess. 2. Colon is mildly distended with air-fluid levels. This is consistent a mild colonic adynamic ileus. 3. No other acute abnormality within the abdomen or pelvis. Electronically Signed   By: Amie Portland M.D.   On: 03/25/2019 18:39   DG Lumbar Spine Complete  Result Date: 03/19/2019 CLINICAL DATA:  L1-L4 XLIF EXAM: LUMBAR SPINE - COMPLETE 4+ VIEW; DG C-ARM 1-60 MIN COMPARISON:  None. FINDINGS: Multiple intraoperative fluoroscopic spot images are  provided. Interval posterior lumbar interbody fusion from L1 through L4. Prior interbody fusion at L5-S1. FLUOROSCOPY TIME:  7 minutes 17 seconds IMPRESSION: Intraoperative localization. Electronically Signed   By: Elige Ko   On: 03/19/2019 12:48   DG Abd 1 View  Result Date: 03/26/2019 CLINICAL DATA:  Persistent back pain after surgery.  Abdominal pain. EXAM: ABDOMEN - 1 VIEW COMPARISON:  CT 03/25/2019. FINDINGS: Distended loops of small and large bowel noted suggesting adynamic ileus. Hemidiaphragms not imaged. No pathologic intra-abdominal calcifications. Postsurgical changes lumbar spine with hardware intact. Degenerative changes lumbar spine and both hips. No acute bony abnormality. IMPRESSION: 1. Distended loops of small and large bowel noted suggesting adynamic ileus. Follow-up exam to demonstrate resolution suggested. 2. Postsurgical changes lumbar spine. Hardware intact. Degenerative changes lumbar spine and both hips. No acute bony abnormality identified. Electronically Signed   By: Maisie Fus  Register   On: 03/26/2019 06:39   DG Abd Portable 1V  Result Date: 03/28/2019 CLINICAL DATA:  Ileus EXAM: PORTABLE ABDOMEN - 1 VIEW COMPARISON:  Yesterday FINDINGS: Stable or mildly improved gaseous distension of colon. No concerning mass effect or gas collection. 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 Extensive lumbar spine fusion. IMPRESSION: Stable or mildly improved colonic distension. Electronically Signed   By: Marnee Spring M.D.   On: 03/28/2019 09:15   DG Abd Portable 1V  Result Date: 03/27/2019 CLINICAL DATA:  Ileus EXAM: PORTABLE ABDOMEN - 1 VIEW COMPARISON:  Yesterday FINDINGS: Continued ileus pattern with unchanged degree of gaseous distension colon diffusely. No concerning mass effect or gas collection. Extensive spinal fusion. Left hip arthroplasty. IMPRESSION: Continued colonic ileus pattern Electronically Signed   By: Marnee Spring M.D.   On: 03/27/2019 08:49   DG C-Arm  1-60 Min  Result Date: 03/19/2019 CLINICAL DATA:  L1-L4 XLIF EXAM: LUMBAR SPINE - COMPLETE 4+ VIEW; DG C-ARM 1-60 MIN COMPARISON:  None. FINDINGS: Multiple intraoperative fluoroscopic spot images are provided. Interval posterior lumbar interbody fusion from L1 through L4. Prior interbody fusion at L5-S1. FLUOROSCOPY TIME:  7 minutes 17 seconds IMPRESSION: Intraoperative localization. Electronically Signed   By: Elige Ko   On: 03/19/2019 12:48       Subjective: NO CHEST PAIN, OR SOB, no nausea, vomiting or abdominal pain.   Discharge Exam: Vitals:   03/29/19 2000 03/30/19 0455  BP: 112/72 135/71  Pulse: 80 76  Resp: 18 18  Temp: 97.7 F (36.5 C) 98.1 F (36.7 C)  SpO2: 98% 96%   Vitals:   03/29/19 1249 03/29/19 2000 03/30/19 0455 03/30/19 0616  BP: 117/70 112/72 135/71   Pulse: 87 80 76   Resp: 18 18 18    Temp: 98 F (36.7 C) 97.7 F (36.5 C) 98.1 F (36.7 C)   TempSrc:  Oral Oral   SpO2: 95% 98% 96%   Weight:    102.3 kg  Height:        General: Pt is alert, awake, not in acute distress Cardiovascular: RRR, S1/S2 +,  no rubs, no gallops Respiratory: CTA bilaterally, no wheezing, no rhonchi Abdominal: Soft, NT, ND, bowel sounds + Extremities: no edema, no cyanosis    The results of significant diagnostics from this hospitalization (including imaging, microbiology, ancillary and laboratory) are listed below for reference.     Microbiology: Recent Results (from the past 240 hour(s))  Urine culture     Status: None   Collection Time: 03/25/19  6:10 PM   Specimen: Urine, Random  Result Value Ref Range Status   Specimen Description URINE, RANDOM  Final   Special Requests NONE  Final   Culture   Final    NO GROWTH Performed at [facility], [address], [place], [place] [address]    Report Status 03/26/2019 FINAL  Final  SARS CORONAVIRUS 2 (TAT 6-24 HRS) Nasopharyngeal Nasopharyngeal Swab     Status: None   Collection Time: 03/25/19 10:14 PM    Specimen: Nasopharyngeal Swab  Result Value Ref Range Status   SARS Coronavirus 2 NEGATIVE NEGATIVE Final    Comment: (NOTE) SARS-CoV-2 target nucleic acids are NOT DETECTED. The SARS-CoV-2 RNA is generally detectable in upper and lower respiratory specimens during the acute phase of infection. Negative results do not preclude SARS-CoV-2 infection, do not rule out co-infections with other pathogens, and should not be used as the sole basis for treatment or other patient management decisions. Negative results must be combined with clinical observations, patient history, and epidemiological information. The expected result is Negative. Fact Sheet for Patients: HairSlick.no Fact Sheet for Healthcare Providers: quierodirigir.com This test is not yet approved or cleared by the [place] FDA and  has been authorized for detection and/or diagnosis of SARS-CoV-2 by FDA under an Emergency Use Authorization (EUA). This EUA will remain  in effect (meaning this test can be used) for the duration of the COVID-19 declaration under Section 56 4(b)(1) of the Act, 21 U.S.C. section 360bbb-3(b)(1), unless the authorization is terminated or revoked sooner. Performed at [facility], [address] [place], [place] [address]      Labs: BNP (last 3 results) No results for input(s): BNP in the last 8760 hours. Basic Metabolic Panel: Recent Labs  Lab 03/26/19 0418 03/27/19 0348 03/28/19 0500 03/29/19 0824 03/30/19 0816  NA 138 137 136 138 137  K 3.2* 3.4* 3.3* 3.4* 3.8  CL 97* 98 98 104 101  CO2 GLUCOSE 139* 126* 146* 177* 154*  BUN CREATININE 1.37* 1.13 1.33* 1.27* 1.22  CALCIUM 8.4* 8.3* 8.5* 8.0* 8.4*  MG 1.8 2.0 2.0  --   --    Liver Function Tests: Recent Labs  Lab 03/25/19 1318 03/28/19 0500  AST 25 20  ALT 18 17  ALKPHOS 74 90  BILITOT 1.1 0.9  PROT 6.1* 5.9*  ALBUMIN 3.0* 2.8*    Recent Labs  Lab 03/25/19 1318  LIPASE 15   No results for input(s): AMMONIA in the last 168 hours. CBC: Recent Labs  Lab 03/25/19 1318 03/26/19 0116 03/26/19 0418  WBC 9.8 9.5 7.9  HGB 11.1* 11.1* 10.6*  HCT 34.3* 34.5* 33.4*  MCV 90.7 90.8 91.8  PLT 421* 411* 410*   Cardiac Enzymes: No results for input(s): CKTOTAL, CKMB, CKMBINDEX, TROPONINI in the last 168 hours. BNP: Invalid input(s): POCBNP CBG: Recent Labs  Lab 03/29/19 0637 03/29/19 1146 03/29/19 1634 03/29/19 2058 03/30/19 0636  GLUCAP 138* 147* 114* 178* 147*   D-Dimer No results for  input(s): DDIMER in the last 72 hours. Hgb A1c No results for input(s): HGBA1C in the last 72 hours. Lipid Profile No results for input(s): CHOL, HDL, LDLCALC, TRIG, CHOLHDL, LDLDIRECT in the last 72 hours. Thyroid function studies No results for input(s): TSH, T4TOTAL, T3FREE, THYROIDAB in the last 72 hours.  Invalid input(s): FREET3 Anemia work up No results for input(s): VITAMINB12, FOLATE, FERRITIN, TIBC, IRON, RETICCTPCT in the last 72 hours. Urinalysis    Component Value Date/Time   COLORURINE YELLOW 03/25/2019 1800   APPEARANCEUR CLEAR 03/25/2019 1800   LABSPEC 1.018 03/25/2019 1800   PHURINE 5.0 03/25/2019 1800   GLUCOSEU NEGATIVE 03/25/2019 1800   HGBUR NEGATIVE 03/25/2019 1800   BILIRUBINUR NEGATIVE 03/25/2019 1800   KETONESUR NEGATIVE 03/25/2019 1800   PROTEINUR NEGATIVE 03/25/2019 1800   NITRITE NEGATIVE 03/25/2019 1800   LEUKOCYTESUR NEGATIVE 03/25/2019 1800   Sepsis Labs Invalid input(s): PROCALCITONIN,  WBC,  LACTICIDVEN Microbiology Recent Results (from the past 240 hour(s))  Urine culture     Status: None   Collection Time: 03/25/19  6:10 PM   Specimen: Urine, Random  Result Value Ref Range Status   Specimen Description URINE, RANDOM  Final   Special Requests NONE  Final   Culture   Final    NO GROWTH Performed at [facility], [address] [place], [place] [address]     Report Status 03/26/2019 FINAL  Final  SARS CORONAVIRUS 2 (TAT 6-24 HRS) Nasopharyngeal Nasopharyngeal Swab     Status: None   Collection Time: 03/25/19 10:14 PM   Specimen: Nasopharyngeal Swab  Result Value Ref Range Status   SARS Coronavirus 2 NEGATIVE NEGATIVE Final    Comment: (NOTE) SARS-CoV-2 target nucleic acids are NOT DETECTED. The SARS-CoV-2 RNA is generally detectable in upper and lower respiratory specimens during the acute phase of infection. Negative results do not preclude SARS-CoV-2 infection, do not rule out co-infections with other pathogens, and should not be used as the sole basis for treatment or other patient management decisions. Negative results must be combined with clinical observations, patient history, and epidemiological information. The expected result is Negative. Fact Sheet for Patients: HairSlick.nohttps://www.fda.gov/media/138098/download Fact Sheet for Healthcare Providers: quierodirigir.comhttps://www.fda.gov/media/138095/download This test is not yet approved or cleared by the [place] FDA and  has been authorized for detection and/or diagnosis of SARS-CoV-2 by FDA under an Emergency Use Authorization (EUA). This EUA will remain  in effect (meaning this test can be used) for the duration of the COVID-19 declaration under Section 56 4(b)(1) of the Act, 21 U.S.C. section 360bbb-3(b)(1), unless the authorization is terminated or revoked sooner. Performed at [facility], [address] [place], [place] [address]      Time coordinating discharge: 32  minutes  SIGNED:   Kathlen ModyVijaya Patty Lopezgarcia, MD  [facility]

## 2020-02-25 IMAGING — DX DG ABD PORTABLE 1V
2 series · 2 of 2 positions shown · non-contrast
Comparison: Yesterday

CLINICAL DATA: Ileus

EXAM:
PORTABLE ABDOMEN - 1 VIEW

[abdomen kub (1 of 2)]
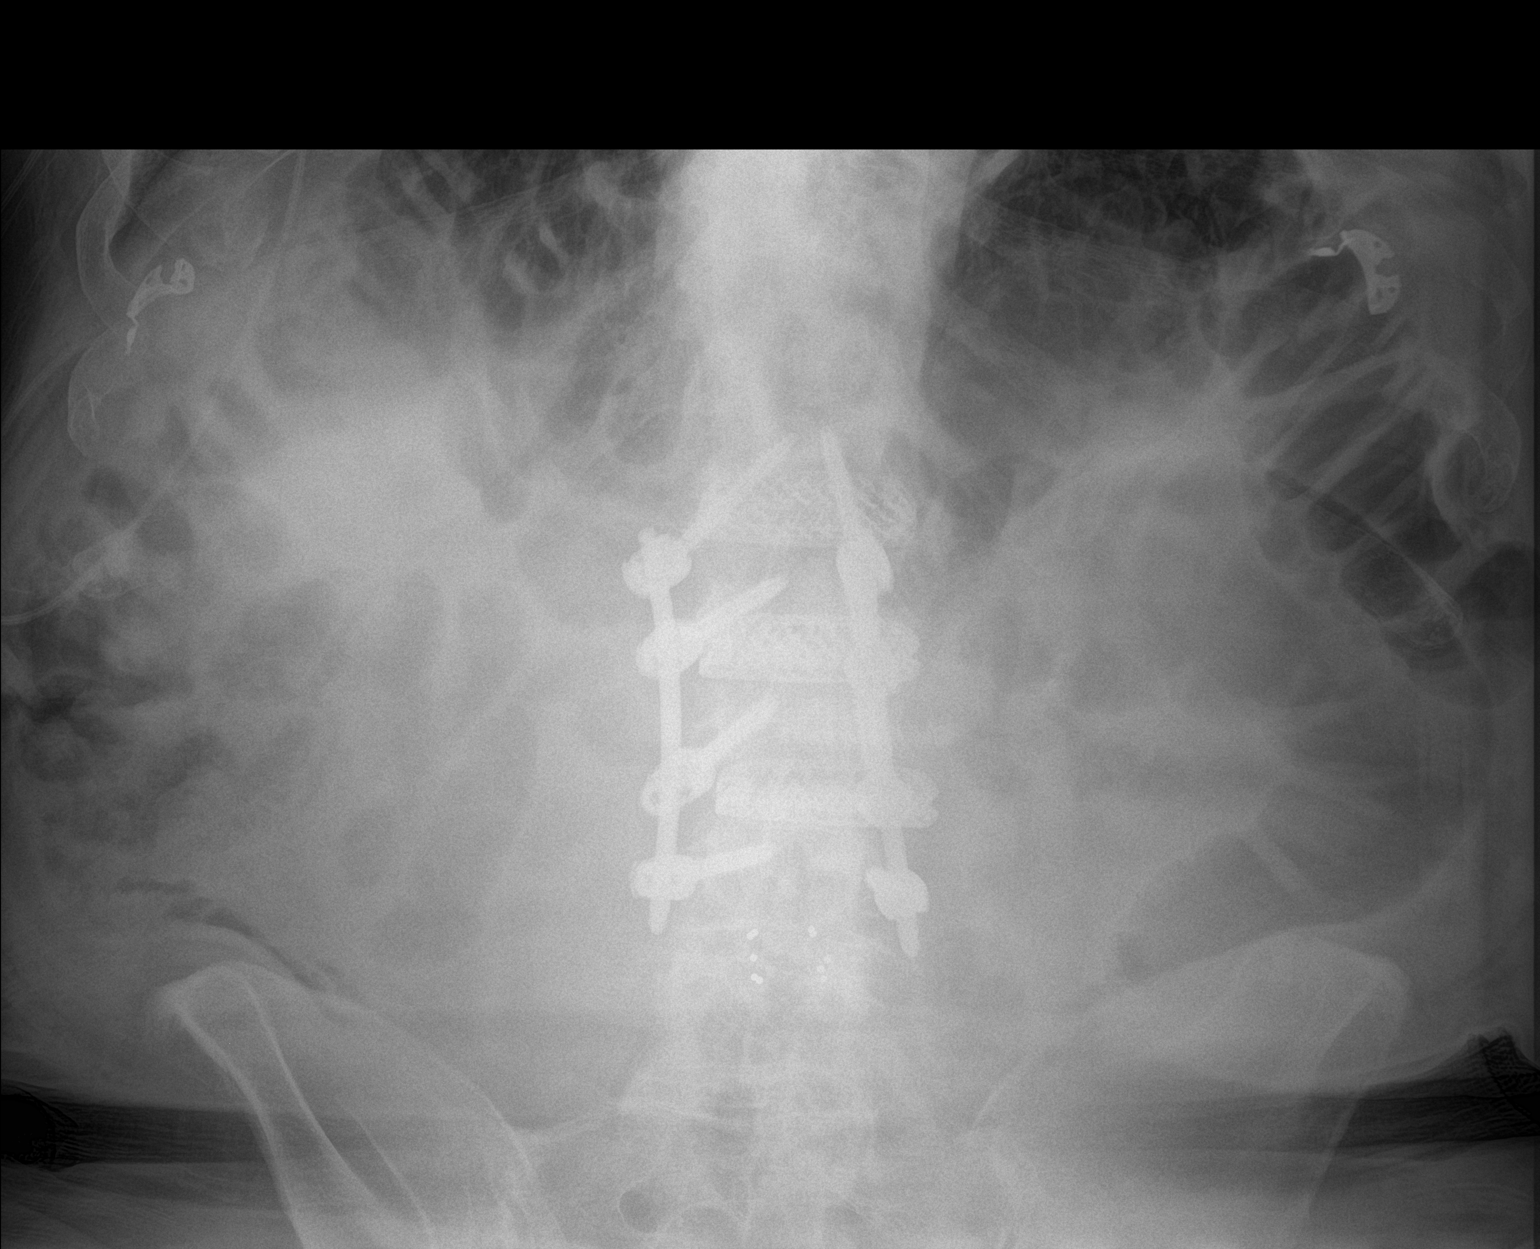

[abdomen kub (2 of 2)]
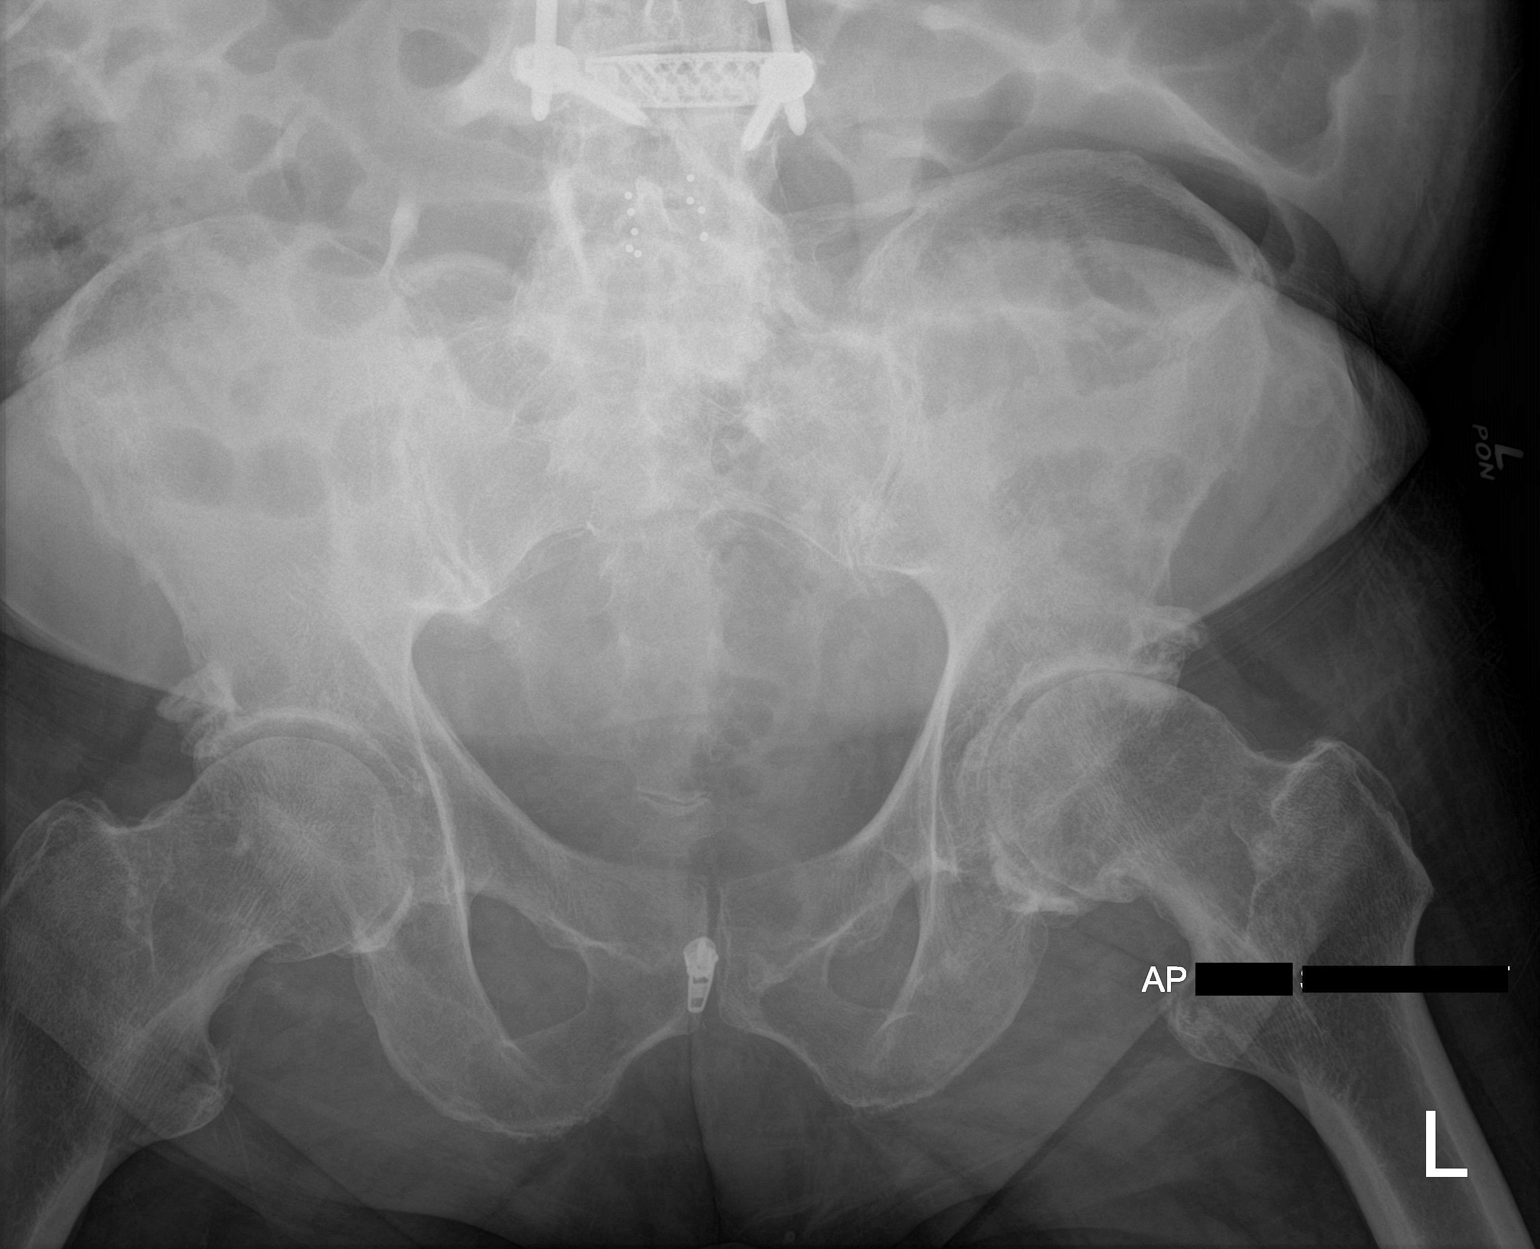

[2 of 2 positions shown; findings below may reference images not displayed]

FINDINGS: Stable or mildly improved gaseous distension of colon. No concerning
mass effect or gas collection.

Hip osteoarthritis that is advanced on the left. Extensive lumbar
spine fusion.
IMPRESSION: Stable or mildly improved colonic distension.

## 2021-03-25 DIAGNOSIS — J9621 Acute and chronic respiratory failure with hypoxia: Secondary | ICD-10-CM

## 2021-03-25 DIAGNOSIS — U071 COVID-19: Secondary | ICD-10-CM

## 2021-03-25 DIAGNOSIS — I2510 Atherosclerotic heart disease of native coronary artery without angina pectoris: Secondary | ICD-10-CM

## 2021-03-25 DIAGNOSIS — J189 Pneumonia, unspecified organism: Secondary | ICD-10-CM
